# Patient Record
Sex: Male | Born: 1970 | Race: Black or African American | Hispanic: No | Marital: Single | Smoking: Current some day smoker
Health system: Southern US, Community
[De-identification: ages and names within clinical notes are randomized; demographics above are authoritative.]

## PROBLEM LIST (undated history)

## (undated) DIAGNOSIS — F419 Anxiety disorder, unspecified: Secondary | ICD-10-CM

## (undated) DIAGNOSIS — F319 Bipolar disorder, unspecified: Secondary | ICD-10-CM

## (undated) DIAGNOSIS — F329 Major depressive disorder, single episode, unspecified: Secondary | ICD-10-CM

## (undated) DIAGNOSIS — F32A Depression, unspecified: Secondary | ICD-10-CM

## (undated) DIAGNOSIS — G43909 Migraine, unspecified, not intractable, without status migrainosus: Secondary | ICD-10-CM

## (undated) HISTORY — DX: Migraine, unspecified, not intractable, without status migrainosus: G43.909

## (undated) HISTORY — PX: NO PAST SURGERIES: SHX2092

---

## 1997-05-24 ENCOUNTER — Emergency Department (HOSPITAL_COMMUNITY): Admission: EM | Admit: 1997-05-24 | Discharge: 1997-05-24 | Payer: Self-pay | Admitting: Emergency Medicine

## 2005-07-18 ENCOUNTER — Emergency Department (HOSPITAL_COMMUNITY): Admission: EM | Admit: 2005-07-18 | Discharge: 2005-07-18 | Payer: Self-pay | Admitting: *Deleted

## 2006-06-19 ENCOUNTER — Emergency Department (HOSPITAL_COMMUNITY): Admission: EM | Admit: 2006-06-19 | Discharge: 2006-06-19 | Payer: Self-pay | Admitting: Emergency Medicine

## 2008-10-16 ENCOUNTER — Emergency Department (HOSPITAL_COMMUNITY): Admission: EM | Admit: 2008-10-16 | Discharge: 2008-10-16 | Payer: Self-pay | Admitting: Emergency Medicine

## 2009-08-14 ENCOUNTER — Emergency Department (HOSPITAL_COMMUNITY): Admission: EM | Admit: 2009-08-14 | Discharge: 2009-08-14 | Payer: Self-pay | Admitting: Emergency Medicine

## 2010-08-08 ENCOUNTER — Emergency Department (HOSPITAL_COMMUNITY)
Admission: EM | Admit: 2010-08-08 | Discharge: 2010-08-08 | Disposition: A | Discharge status: Home / Self Care | Payer: Self-pay | Attending: Emergency Medicine | Admitting: Emergency Medicine

## 2010-08-08 DIAGNOSIS — M25519 Pain in unspecified shoulder: Secondary | ICD-10-CM | POA: Insufficient documentation

## 2010-08-08 DIAGNOSIS — R209 Unspecified disturbances of skin sensation: Secondary | ICD-10-CM | POA: Insufficient documentation

## 2010-10-28 LAB — CULTURE, ROUTINE-ABSCESS

## 2011-01-27 ENCOUNTER — Encounter (HOSPITAL_COMMUNITY): Payer: Self-pay | Admitting: *Deleted

## 2011-01-27 ENCOUNTER — Emergency Department (HOSPITAL_COMMUNITY)
Admission: EM | Admit: 2011-01-27 | Discharge: 2011-01-27 | Disposition: A | Discharge status: Home / Self Care | Payer: Self-pay | Attending: Emergency Medicine | Admitting: Emergency Medicine

## 2011-01-27 DIAGNOSIS — Z79899 Other long term (current) drug therapy: Secondary | ICD-10-CM | POA: Insufficient documentation

## 2011-01-27 DIAGNOSIS — R51 Headache: Secondary | ICD-10-CM | POA: Insufficient documentation

## 2011-01-27 DIAGNOSIS — J3489 Other specified disorders of nose and nasal sinuses: Secondary | ICD-10-CM | POA: Insufficient documentation

## 2011-01-27 DIAGNOSIS — F3130 Bipolar disorder, current episode depressed, mild or moderate severity, unspecified: Secondary | ICD-10-CM | POA: Insufficient documentation

## 2011-01-27 HISTORY — DX: Bipolar disorder, unspecified: F31.9

## 2011-01-27 HISTORY — DX: Depression, unspecified: F32.A

## 2011-01-27 HISTORY — DX: Major depressive disorder, single episode, unspecified: F32.9

## 2011-01-27 HISTORY — DX: Anxiety disorder, unspecified: F41.9

## 2011-01-27 MED ORDER — KETOROLAC TROMETHAMINE 60 MG/2ML IM SOLN
60.0000 mg | Freq: Once | INTRAMUSCULAR | Status: AC
  Administered 2011-01-27: 60 mg via INTRAMUSCULAR
  Filled 2011-01-27: qty 2

## 2011-01-27 NOTE — ED Notes (Signed)
Pt states "I have been having these h/a's normally around this time of year, have been having 2-3 a day, normally makes my ear hurt"

## 2011-01-27 NOTE — ED Provider Notes (Signed)
Medical screening examination/treatment/procedure(s) were performed by non-physician practitioner and as supervising physician I was immediately available for consultation/collaboration.  Cora Brierley R. Stevon Gough, MD 01/27/11 2308 

## 2011-01-27 NOTE — ED Provider Notes (Signed)
History     CSN: [id number]  Arrival date & time 01/27/11  1444   First MD Initiated Contact with Patient 01/27/11 1653      Chief Complaint  Patient presents with  . Headache    (Consider location/radiation/quality/duration/timing/severity/associated sxs/prior treatment) Patient is a 41 y.o. male presenting with headaches. The history is provided by the patient.  Headache  This is a recurrent problem.  Pt reports HAs that occur chronically "around this time of year". Has been experiencing this time for the last 2 weeks, several times a day, lasting a couple of hours at a time. Pain is severe, throbbing, typically located behind one eye or the other, currently behind the left eye. Pain does not radiate. Nothing in particular makes the pain worse. Took benadryl today prior to arrival with moderate relief of pain. There has been some assoc nasal congestion.  There is no associated dizziness, lightheadedness, head injury, confusion, change in vision, tinnitus, neck pain/stiffness, chest pain, SOB, N/V, numbness/weakness in any part of the body, or rash.  Past Medical History  Diagnosis Date  . Bipolar disorder   . Depression   . Anxiety     History reviewed. No pertinent past surgical history.  No family history on file.  History  Substance Use Topics  . Smoking status: Current Everyday Smoker -- 0.5 packs/day  . Smokeless tobacco: Not on file  . Alcohol Use: 3.6 oz/week    6 Cans of beer per week      Review of Systems  Neurological: Positive for headaches.  10 systems reviewed and are negative for acute change except as noted in the HPI.   Allergies  Review of patient's allergies indicates no known allergies.  Home Medications   Current Outpatient Rx  Name Route Sig Dispense Refill  . DIPHENHYDRAMINE HCL 25 MG PO TABS Oral Take 25 mg by mouth every 6 (six) hours as needed. allergies    . IBUPROFEN 200 MG PO TABS Oral Take 600 mg by mouth every 6 (six) hours as  needed. pain    . SERTRALINE HCL 100 MG PO TABS Oral Take 100 mg by mouth daily.    . TRAZODONE HCL 100 MG PO TABS Oral Take 100 mg by mouth at bedtime.      BP 111/60  Pulse 87  Temp 98 F (36.7 C)  Resp 20  Wt 238 lb (107.956 kg)  SpO2 99%  Physical Exam  Nursing note and vitals reviewed. Constitutional: He is oriented to person, place, and time. He appears well-developed and well-nourished. No distress.  HENT:  Head: Normocephalic and atraumatic.  Right Ear: External ear normal.  Left Ear: External ear normal.  Nose: Nose normal.  Mouth/Throat: Oropharynx is clear and moist. No oropharyngeal exudate.       Bilateral TM normal  Eyes: Conjunctivae and EOM are normal. Pupils are equal, round, and reactive to light.       No nystagmus. Visual fields full to confrontation bilaterally.  Neck: Normal range of motion. Neck supple.  Cardiovascular: Normal rate, regular rhythm and normal heart sounds.   No murmur heard. Pulmonary/Chest: Effort normal and breath sounds normal. No respiratory distress. He has no wheezes. He exhibits no tenderness.  Abdominal: Soft. Bowel sounds are normal. There is no tenderness.  Musculoskeletal: He exhibits no edema and no tenderness.  Lymphadenopathy:    He has no cervical adenopathy.  Neurological: He is alert and oriented to person, place, and time. No cranial nerve deficit. Coordination (F-N  intact bilaterally.) normal.       Gait normal without ataxia. MAEW. Sensation intact to light touch.  GCS 15.  Skin: Skin is warm and dry. No rash noted.  Psychiatric: He has a normal mood and affect. His behavior is normal.    ED Course  Procedures (including critical care time)  Labs Reviewed - No data to display No results found.   Dx 1: HA   MDM  HA, recurrent. No fever, neck pain- do not suspect meningitis. Neuro exam grossly normal, no head injury, do not suspect SDH. Not sudden onset or "worst HA of life", typical for pt- do not suspect SAH.  Advised NSAID use with primary doctor follow-up for continued management.   [address] [place], [place] 01/27/11 1749

## 2011-01-27 NOTE — ED Notes (Signed)
Pt c/o of headache and right ear pain. Pt reports that headaches have been intermittent 2-3 times a day.

## 2017-10-08 ENCOUNTER — Emergency Department (HOSPITAL_COMMUNITY)
Admission: EM | Admit: 2017-10-08 | Discharge: 2017-10-08 | Disposition: A | Discharge status: Home / Self Care | Payer: Self-pay | Attending: Emergency Medicine | Admitting: Emergency Medicine

## 2017-10-08 ENCOUNTER — Encounter (HOSPITAL_COMMUNITY): Payer: Self-pay

## 2017-10-08 ENCOUNTER — Emergency Department (HOSPITAL_COMMUNITY): Payer: Self-pay

## 2017-10-08 ENCOUNTER — Other Ambulatory Visit: Payer: Self-pay

## 2017-10-08 DIAGNOSIS — F419 Anxiety disorder, unspecified: Secondary | ICD-10-CM | POA: Insufficient documentation

## 2017-10-08 DIAGNOSIS — F319 Bipolar disorder, unspecified: Secondary | ICD-10-CM | POA: Insufficient documentation

## 2017-10-08 DIAGNOSIS — F17200 Nicotine dependence, unspecified, uncomplicated: Secondary | ICD-10-CM | POA: Insufficient documentation

## 2017-10-08 DIAGNOSIS — K5900 Constipation, unspecified: Secondary | ICD-10-CM | POA: Insufficient documentation

## 2017-10-08 DIAGNOSIS — R202 Paresthesia of skin: Secondary | ICD-10-CM | POA: Insufficient documentation

## 2017-10-08 LAB — COMPREHENSIVE METABOLIC PANEL
ALK PHOS: 64 U/L (ref 38–126)
ALT: 27 U/L (ref 0–44)
AST: 28 U/L (ref 15–41)
Albumin: 3.7 g/dL (ref 3.5–5.0)
Anion gap: 12 (ref 5–15)
BILIRUBIN TOTAL: 0.6 mg/dL (ref 0.3–1.2)
BUN: 6 mg/dL (ref 6–20)
CALCIUM: 9 mg/dL (ref 8.9–10.3)
CHLORIDE: 104 mmol/L (ref 98–111)
CO2: 24 mmol/L (ref 22–32)
CREATININE: 1.17 mg/dL (ref 0.61–1.24)
GFR calc Af Amer: >60 mL/min (ref >60)
GLUCOSE: 94 mg/dL (ref 70–99)
Potassium: 3.5 mmol/L (ref 3.5–5.1)
Sodium: 140 mmol/L (ref 135–145)
Total Protein: 7.1 g/dL (ref 6.5–8.1)

## 2017-10-08 LAB — CBC
HCT: 43 % (ref 39.0–52.0)
Hemoglobin: 14.3 g/dL (ref 13.0–17.0)
MCH: 30.9 pg (ref 26.0–34.0)
MCHC: 33.3 g/dL (ref 30.0–36.0)
MCV: 92.9 fL (ref 78.0–100.0)
PLATELETS: 316 10*3/uL (ref 150–400)
RBC: 4.63 MIL/uL (ref 4.22–5.81)
RDW: 13.5 % (ref 11.5–15.5)
WBC: 7.9 10*3/uL (ref 4.0–10.5)

## 2017-10-08 LAB — URINALYSIS, ROUTINE W REFLEX MICROSCOPIC
BILIRUBIN URINE: NEGATIVE
GLUCOSE, UA: NEGATIVE mg/dL
HGB URINE DIPSTICK: NEGATIVE
Ketones, ur: NEGATIVE mg/dL
Leukocytes, UA: NEGATIVE
Nitrite: NEGATIVE
Protein, ur: NEGATIVE mg/dL
Specific Gravity, Urine: 1.003 — ABNORMAL LOW (ref 1.005–1.030)
pH: 6 (ref 5.0–8.0)

## 2017-10-08 LAB — CK TOTAL AND CKMB (NOT AT ARMC)
CK TOTAL: 331 U/L (ref 49–397)
CK, MB: 4 ng/mL (ref 0.5–5.0)
Relative Index: 1.2 (ref 0.0–2.5)

## 2017-10-08 LAB — LIPASE, BLOOD: Lipase: 35 U/L (ref 11–51)

## 2017-10-08 LAB — MAGNESIUM: Magnesium: 2.1 mg/dL (ref 1.7–2.4)

## 2017-10-08 MED ORDER — SODIUM CHLORIDE 0.9 % IV BOLUS
1000.0000 mL | Freq: Once | INTRAVENOUS | Status: AC
  Administered 2017-10-08: 1000 mL via INTRAVENOUS

## 2017-10-08 MED ORDER — POLYETHYLENE GLYCOL 3350 17 G PO PACK: 17.0000 g | PACK | Freq: Every day | ORAL | 0 refills | Status: DC

## 2017-10-08 MED ORDER — DICYCLOMINE HCL 20 MG PO TABS: 20.0000 mg | ORAL_TABLET | Freq: Two times a day (BID) | ORAL | 0 refills | Status: DC

## 2017-10-08 MED ORDER — DICYCLOMINE HCL 10 MG PO CAPS
10.0000 mg | ORAL_CAPSULE | Freq: Once | ORAL | Status: AC
  Administered 2017-10-08: 10 mg via ORAL
  Filled 2017-10-08: qty 1

## 2017-10-08 NOTE — ED Provider Notes (Signed)
[facility]-EMERGENCY DEPT Provider Note   CSN: [id number] Arrival date & time: 10/08/17  1403     History   Chief Complaint Chief Complaint  Patient presents with  . Constipation  . Abdominal Pain  . numbness thighs  . abdominal distention.    HPI Jonathon Dickerson is a 47 y.o. male with PMH/o Anxiety, Bipolar who presents for evaluation of abnormal bowel movements, abdominal distention has been ongoing for last 2 days.  He reports that 2 days ago, he took 59 Vitafusion men's vitamins gummies.  He states that he took them because "they tasted good."  Patient states that since then, he has had abnormal bowel movements he states that normally he goes every day.  He states that since then, his bowel movements have been more loose than normal.  He reports that he had a watery, loose bowel movement yesterday.  He reports that this is not his normal bowel movements.  He took an over-the-counter laxative yesterday to see if that would help with symptoms.  Denies any recent antibiotics or travel outside the country.  No blood noted in stool.  He is still passing flatus.  He states that he feels like his abdomen is distended.  He states that he feels like he is cramping and stiff from his abdomen to his legs.  He also reports he has been having some numbness/tingling sensation to the distal ends of bilateral fingertips and bilateral thighs.  He states that the numbness sensation is making it hard for him to walk.  He states that he has not had any speech difficulty, blurry vision.  Patient denies any fevers, chest pain, difficulty breathing.   The history is provided by the patient.    Past Medical History:  Diagnosis Date  . Anxiety   . Bipolar disorder (HCC)   . Depression     There are no active problems to display for this patient.   History reviewed. No pertinent surgical history.      Home Medications    Prior to Admission medications   Medication Sig  Start Date End Date Taking? Authorizing Provider  Sennosides 25 MG TABS Take 25-50 mg by mouth 2 (two) times daily as needed (constipation).   Yes [provider]  dicyclomine (BENTYL) 20 MG tablet Take 1 tablet (20 mg total) by mouth 2 (two) times daily. 10/08/17   Maxwell Caul, PA-C  polyethylene glycol (MIRALAX) packet Take 17 g by mouth daily. 10/08/17   Maxwell Caul, PA-C    Family History Family History  Problem Relation Age of Onset  . Diabetes Mother     Social History Social History   Tobacco Use  . Smoking status: Current Every Day Smoker    Packs/day: 0.15  . Smokeless tobacco: Never Used  Substance Use Topics  . Alcohol use: Yes    Alcohol/week: 6.0 standard drinks    Types: 6 Cans of beer per week  . Drug use: No     Allergies   Patient has no known allergies.   Review of Systems Review of Systems  Constitutional: Negative for fever.  Respiratory: Negative for cough and shortness of breath.   Cardiovascular: Negative for chest pain.  Gastrointestinal: Positive for abdominal distention, constipation and diarrhea. Negative for abdominal pain, nausea and vomiting.  Genitourinary: Negative for dysuria and hematuria.  Neurological: Positive for numbness. Negative for weakness and headaches.  All other systems reviewed and are negative.    Physical Exam Updated  Vital Signs BP (!) 117/59   Pulse (!) 48   Temp 97.7 F (36.5 C) (Oral)   Resp 15   Ht 5\' 9"  (1.753 m)   Wt 99.8 kg   SpO2 100%   BMI 32.49 kg/m   Physical Exam  Constitutional: He is oriented to person, place, and time. He appears well-developed and well-nourished.  HENT:  Head: Normocephalic and atraumatic.  Mouth/Throat: Oropharynx is clear and moist and mucous membranes are normal.  Eyes: Pupils are equal, round, and reactive to light. Conjunctivae, EOM and lids are normal.  Neck: Full passive range of motion without pain.  Cardiovascular: Normal rate, regular rhythm,  normal heart sounds and normal pulses. Exam reveals no gallop and no friction rub.  No murmur heard. Pulses:      Radial pulses are 2+ on the right side, and 2+ on the left side.       Dorsalis pedis pulses are 2+ on the right side, and 2+ on the left side.  Pulmonary/Chest: Effort normal and breath sounds normal.  Abdominal: Soft. Normal appearance. He exhibits no distension. There is generalized tenderness. There is no rigidity and no guarding.  Abdomen is soft, non-distended.  Diffuse tenderness noted throughout. No focal tenderness.  No rigidity, guarding.  Musculoskeletal: Normal range of motion.  Neurological: He is alert and oriented to person, place, and time.  Cranial nerves III-XII intact Follows commands, Moves all extremities  5/5 strength to BUE and BLE  Subjective decreased sensation noted to the distal tips of the bilateral fingers and anterior aspects of bilateral thighs. Otherwise sensation intact. Decreased sensation does not follow a specific dermatoma. Able to distinguish soft and hard except for scattered places to the anterior thigh  Normal finger to nose. No dysdiadochokinesia. No pronator drift. No gait abnormalities  No slurred speech. No facial droop.   Skin: Skin is warm and dry. Capillary refill takes less than 2 seconds.  Good distal cap refill. BUE and BLE are not dusky in appearance or cool to touch.  Psychiatric: He has a normal mood and affect. His speech is normal.  Nursing note and vitals reviewed.    ED Treatments / Results  Labs (all labs ordered are listed, but only abnormal results are displayed) Labs Reviewed  URINALYSIS, ROUTINE W REFLEX MICROSCOPIC - Abnormal; Notable for the following components:      Result Value   Color, Urine STRAW (*)    Specific Gravity, Urine 1.003 (*)    All other components within normal limits  LIPASE, BLOOD  COMPREHENSIVE METABOLIC PANEL  CBC  CK TOTAL AND CKMB (NOT AT [facility])  MAGNESIUM     EKG None  Radiology Dg Abd 2 Views  Result Date: 10/08/2017 CLINICAL DATA:  Constipation, abdominal distention. EXAM: ABDOMEN - 2 VIEW COMPARISON:  None. FINDINGS: The bowel gas pattern is normal. Moderate amount of stool seen throughout the colon. There is no evidence of free air. No radio-opaque calculi or other significant radiographic abnormality is seen. IMPRESSION: Moderate stool burden.  No evidence of bowel obstruction or ileus. Electronically Signed   By: Lupita Raider, M.D.   On: 10/08/2017 19:27    Procedures Procedures (including critical care time)  Medications Ordered in ED Medications  sodium chloride 0.9 % bolus 1,000 mL (0 mLs Intravenous Stopped 10/08/17 2147)  dicyclomine (BENTYL) capsule 10 mg (10 mg Oral Given 10/08/17 2147)     Initial Impression / Assessment and Plan / ED Course  I have reviewed the triage  vital signs and the nursing notes.  Pertinent labs & imaging results that were available during my care of the patient were reviewed by me and considered in my medical decision making (see chart for details).     47 year old male past with history of anxiety and bipolar who presents for evaluation of constipation, abdominal distention, numbness to bilateral fingertips and anterior aspect of thighs.  He states that this occurred for last 2 days after he ate 33 vitamins.  He states that he was doing it because "they tasted good."  Patient states he has had bowel movements but states that they are softer than he normally experiences.  No blood noted in stool.  Last bowel movement yesterday.  He still passing flatus.  No vomiting, fevers. Patient is afebrile, non-toxic appearing, sitting comfortably on examination table. Vital signs reviewed and stable.  On exam, patient with diffuse abdominal tenderness.  No focal point.  Abdomen is soft, not distended.  Normal neuro exam.  He does have some subjective decreased sensation to scattered points to tips of fingers  bilaterally in anterior aspect of thighs.  He is otherwise able to differentiate between sharp and dull.  Consider likely imbalance versus anxiety.  At this time, do not suspect small bowel obstruction as patient has not had any vomiting.  He still passing flatus without any difficulty.  He has been having bowel movements he does states that they are more loose than normal.  Additionally, do not suspect appendicitis, diverticulitis, perforation given reassuring exam.  I suspect that his paresthesias are likely secondary to anxiety versus electively imbalance.  His distribution and the fact that is bilateral and more towards the tips of his fingers and anterior aspects of his thighs is not concerning for CVA.  Additionally, his decreased sensation does not follow specific dermatome.  He exhibits no other neuro deficits that would be concerning for CVA.  Patient states that he took the vitamins because "they tasted good."  Do not suspect self-harm.  Discussed with Dr. Rush Landmark.  We will add additional magnesium and CK for evaluation of electrolyte abnormalities.  UA reviewed.  Negative for any acute infection.  Lipase unremarkable.  CBC without any significant leukocytosis or anemia.  CMP is unremarkable.  Mag is unremarkable.  CKs without any acute ab normalities.  Abdominal exam shows moderate stool burden.  No evidence of obstruction.  Discussed results with patient.  Repeat abdominal abdominal exam is benign.  Exam not concerning for diverticular-itis, appendicitis, perforation, obstruction.  I discussed with him that we could try sending him home on MiraLAX or do an enema for continued constipation.  At this time, do not suspect the patient is impacted as he is still having bowel movements they just softer.  Patient opted for MiraLAX at home.  Instructed not to take any more vitamins.  Encouraged him to follow-up with [facility] to establish primary care.  At this time, patient exhibits no emergent  life-threatening condition that require further evaluation in ED.  Final Clinical Impressions(s) / ED Diagnoses   Final diagnoses:  Constipation, unspecified constipation type  Paresthesia    ED Discharge Orders         Ordered    dicyclomine (BENTYL) 20 MG tablet  2 times daily     10/08/17 2130    polyethylene glycol (MIRALAX) packet  Daily     10/08/17 2130           Maxwell Caul, PA-C 10/08/17 2320  Tegeler, Canary Brim, MD 10/09/17 [phone]

## 2017-10-08 NOTE — ED Notes (Signed)
Blood draw attempted in left ac, small blood return followed by vein collapse before collection

## 2017-10-08 NOTE — ED Triage Notes (Addendum)
Patient reports that he bought some Vita Fusion Men's vitmins and took 33 vitamins in the past 2 days. Patient states that he has not had a normal BM in 2 days, abdominal distention and numbness in his fingers and thighs. Patient states that he took a laxative with no relief.

## 2017-10-08 NOTE — Discharge Instructions (Addendum)
Take Bentyl as directed.  Take MiraLAX as directed to help with constipation.  When she is having regular bowel movements, stop taking it.  As we discussed, do not take any more vitamins.  Follow-up with your primary care doctor or [facility] to establish primary care.  Please return to the emerge department for any fevers, vomiting, blood in stool, difficulty breathing, weakness of one arm or one leg, any other worsening concerning symptoms.

## 2017-10-11 ENCOUNTER — Other Ambulatory Visit: Payer: Self-pay

## 2017-10-11 ENCOUNTER — Encounter (HOSPITAL_BASED_OUTPATIENT_CLINIC_OR_DEPARTMENT_OTHER): Payer: Self-pay

## 2017-10-11 DIAGNOSIS — Z79899 Other long term (current) drug therapy: Secondary | ICD-10-CM | POA: Insufficient documentation

## 2017-10-11 DIAGNOSIS — R202 Paresthesia of skin: Secondary | ICD-10-CM | POA: Insufficient documentation

## 2017-10-11 DIAGNOSIS — R269 Unspecified abnormalities of gait and mobility: Secondary | ICD-10-CM | POA: Insufficient documentation

## 2017-10-11 DIAGNOSIS — F17210 Nicotine dependence, cigarettes, uncomplicated: Secondary | ICD-10-CM | POA: Insufficient documentation

## 2017-10-11 NOTE — ED Triage Notes (Signed)
Pt was seen on 10/08/17 at [facility], is here for the exact same complaints, feels like his abdomen is "tight" which it is not, and states his paresthesias are still bothering him, pt has not scheduled a f/u outpatient appointment as was recommended

## 2017-10-12 ENCOUNTER — Emergency Department (HOSPITAL_COMMUNITY): Payer: Self-pay

## 2017-10-12 ENCOUNTER — Emergency Department (HOSPITAL_BASED_OUTPATIENT_CLINIC_OR_DEPARTMENT_OTHER)
Admission: EM | Admit: 2017-10-12 | Discharge: 2017-10-12 | Disposition: A | Discharge status: Home / Self Care | Payer: Self-pay | Attending: Emergency Medicine | Admitting: Emergency Medicine

## 2017-10-12 ENCOUNTER — Emergency Department (HOSPITAL_BASED_OUTPATIENT_CLINIC_OR_DEPARTMENT_OTHER): Payer: Self-pay

## 2017-10-12 DIAGNOSIS — R269 Unspecified abnormalities of gait and mobility: Secondary | ICD-10-CM

## 2017-10-12 DIAGNOSIS — R202 Paresthesia of skin: Secondary | ICD-10-CM

## 2017-10-12 LAB — COMPREHENSIVE METABOLIC PANEL
ALBUMIN: 3.9 g/dL (ref 3.5–5.0)
ALT: 34 U/L (ref 0–44)
ANION GAP: 8 (ref 5–15)
AST: 31 U/L (ref 15–41)
Alkaline Phosphatase: 63 U/L (ref 38–126)
BILIRUBIN TOTAL: 0.3 mg/dL (ref 0.3–1.2)
BUN: 16 mg/dL (ref 6–20)
CO2: 24 mmol/L (ref 22–32)
Calcium: 9.2 mg/dL (ref 8.9–10.3)
Chloride: 107 mmol/L (ref 98–111)
Creatinine, Ser: 1.29 mg/dL — ABNORMAL HIGH (ref 0.61–1.24)
GFR calc Af Amer: >60 mL/min (ref >60)
GFR calc non Af Amer: >60 mL/min (ref >60)
GLUCOSE: 93 mg/dL (ref 70–99)
Potassium: 3.9 mmol/L (ref 3.5–5.1)
Sodium: 139 mmol/L (ref 135–145)
Total Protein: 7.6 g/dL (ref 6.5–8.1)

## 2017-10-12 LAB — RAPID URINE DRUG SCREEN, HOSP PERFORMED
Amphetamines: NOT DETECTED
Barbiturates: NOT DETECTED
Benzodiazepines: NOT DETECTED
Cocaine: POSITIVE — AB
OPIATES: NOT DETECTED
TETRAHYDROCANNABINOL: NOT DETECTED

## 2017-10-12 LAB — URINALYSIS, ROUTINE W REFLEX MICROSCOPIC
BILIRUBIN URINE: NEGATIVE
Glucose, UA: NEGATIVE mg/dL
KETONES UR: NEGATIVE mg/dL
Leukocytes, UA: NEGATIVE
NITRITE: NEGATIVE
PROTEIN: NEGATIVE mg/dL
pH: 5.5 (ref 5.0–8.0)

## 2017-10-12 LAB — CBC WITH DIFFERENTIAL/PLATELET
BASOS PCT: 1 %
Basophils Absolute: 0.1 10*3/uL (ref 0.0–0.1)
Eosinophils Absolute: 0.4 10*3/uL (ref 0.0–0.7)
Eosinophils Relative: 4 %
HCT: 45 % (ref 39.0–52.0)
HEMOGLOBIN: 15.3 g/dL (ref 13.0–17.0)
LYMPHS ABS: 3.1 10*3/uL (ref 0.7–4.0)
LYMPHS PCT: 30 %
MCH: 30.9 pg (ref 26.0–34.0)
MCHC: 34 g/dL (ref 30.0–36.0)
MCV: 90.9 fL (ref 78.0–100.0)
MONOS PCT: 7 %
Monocytes Absolute: 0.7 10*3/uL (ref 0.1–1.0)
NEUTROS ABS: 5.9 10*3/uL (ref 1.7–7.7)
NEUTROS PCT: 58 %
Platelets: 306 10*3/uL (ref 150–400)
RBC: 4.95 MIL/uL (ref 4.22–5.81)
RDW: 13.8 % (ref 11.5–15.5)
WBC: 10.1 10*3/uL (ref 4.0–10.5)

## 2017-10-12 LAB — URINALYSIS, MICROSCOPIC (REFLEX)

## 2017-10-12 LAB — LIPASE, BLOOD: LIPASE: 38 U/L (ref 11–51)

## 2017-10-12 LAB — CK: CK TOTAL: 207 U/L (ref 49–397)

## 2017-10-12 LAB — I-STAT CG4 LACTIC ACID, ED: Lactic Acid, Venous: 0.48 mmol/L — ABNORMAL LOW (ref 0.5–1.9)

## 2017-10-12 MED ORDER — IOPAMIDOL (ISOVUE-300) INJECTION 61%
100.0000 mL | Freq: Once | INTRAVENOUS | Status: AC | PRN
  Administered 2017-10-12: 100 mL via INTRAVENOUS

## 2017-10-12 MED ORDER — OXYCODONE-ACETAMINOPHEN 5-325 MG PO TABS
1.0000 | ORAL_TABLET | Freq: Once | ORAL | Status: AC
  Administered 2017-10-12: 1 via ORAL
  Filled 2017-10-12: qty 1

## 2017-10-12 NOTE — ED Notes (Signed)
Patient transported to CT 

## 2017-10-12 NOTE — ED Notes (Signed)
Patient transported to MRI 

## 2017-10-12 NOTE — ED Notes (Signed)
Patient has returned from MRI

## 2017-10-12 NOTE — ED Provider Notes (Signed)
[facility] EMERGENCY DEPARTMENT Provider Note   CSN: [id number] Arrival date & time: 10/11/17  2332     History   Chief Complaint Chief Complaint  Patient presents with  . Multiple Complaints    HPI RIELEY Dickerson is a 47 y.o. male.  -Patient reports tight sensation in his abdomen that radiates down to his thighs.  Reports this is been going on for about the past 5 days.  He was seen in the ED for this on September 29 and discharged home.  He states the symptoms started after he took 33 gummy vitamins within 48 hours.  He reports he is getting worse since he was seen in the [facility] ED has had difficulty walking because of numbness in his legs and numbness in his hands.  He describes numbness in his bilateral feet as well as his fingertips bilaterally without muscular weakness.  He has not had any fevers, chills, nausea or vomiting.  Has had several episodes of diarrhea since he was last seen in the ED. But he has been using stool softeners. He is still wanting to eat and drink.  No chest pain or shortness of breath.  He feels stiffness and cramping in his legs and is having to walk with a wide-based gait which is something new.  He describes a feeling of tightness and something wrapped around his abdomen and thighs.  The history is provided by the patient.    Past Medical History:  Diagnosis Date  . Anxiety   . Bipolar disorder (HCC)   . Depression     There are no active problems to display for this patient.   History reviewed. No pertinent surgical history.      Home Medications    Prior to Admission medications   Medication Sig Start Date End Date Taking? Authorizing Provider  dicyclomine (BENTYL) 20 MG tablet Take 1 tablet (20 mg total) by mouth 2 (two) times daily. 10/08/17   Maxwell Caul, PA-C  polyethylene glycol (MIRALAX) packet Take 17 g by mouth daily. 10/08/17   Graciella Freer A, PA-C  Sennosides 25 MG TABS Take 25-50 mg by mouth 2 (two)  times daily as needed (constipation).    [provider]    Family History Family History  Problem Relation Age of Onset  . Diabetes Mother     Social History Social History   Tobacco Use  . Smoking status: Current Every Day Smoker    Packs/day: 0.15  . Smokeless tobacco: Never Used  Substance Use Topics  . Alcohol use: Yes    Alcohol/week: 6.0 standard drinks    Types: 6 Cans of beer per week  . Drug use: No     Allergies   Patient has no known allergies.   Review of Systems Review of Systems  Constitutional: Positive for activity change and appetite change. Negative for fever.  HENT: Negative for congestion, nosebleeds and postnasal drip.   Respiratory: Negative for cough, chest tightness and shortness of breath.   Cardiovascular: Negative for chest pain and leg swelling.  Gastrointestinal: Positive for abdominal pain and diarrhea. Negative for nausea and vomiting.  Genitourinary: Negative for decreased urine volume, dysuria, hematuria and urgency.  Musculoskeletal: Positive for arthralgias and myalgias.  Skin: Negative for rash.  Neurological: Positive for weakness and numbness. Negative for dizziness and headaches.   all other systems are negative except as noted in the HPI and PMH.     Physical Exam Updated Vital Signs BP 110/71 (  BP Location: Left Arm)   Pulse 65   Temp 98.5 F (36.9 C) (Oral)   Resp 18   Ht 5\' 9"  (1.753 m)   Wt 99.7 kg   SpO2 96%   BMI 32.46 kg/m   Physical Exam  Constitutional: He is oriented to person, place, and time. He appears well-developed and well-nourished. No distress.  HENT:  Head: Normocephalic and atraumatic.  Mouth/Throat: Oropharynx is clear and moist. No oropharyngeal exudate.  Eyes: Pupils are equal, round, and reactive to light. Conjunctivae and EOM are normal.  Neck: Normal range of motion. Neck supple.  No meningismus.  Cardiovascular: Normal rate, regular rhythm, normal heart sounds and intact distal  pulses.  No murmur heard. Pulmonary/Chest: Effort normal and breath sounds normal. No respiratory distress. He exhibits no tenderness.  Abdominal: Soft. There is tenderness. There is no rebound and no guarding.  Mild diffuse tenderness  Genitourinary:  Genitourinary Comments: Rectal sensation normal no fecal impaction  Musculoskeletal: Normal range of motion. He exhibits no edema or tenderness.  5/5 strength in bilateral lower extremities. Ankle plantar and dorsiflexion intact. Great toe extension intact bilaterally. +2 DP and PT pulses. +2 patellar reflexes bilaterally.   Wide-based ataxic gait  Neurological: He is alert and oriented to person, place, and time. No cranial nerve deficit. He exhibits normal muscle tone. Coordination abnormal.  No ataxia on finger to nose bilaterally. No pronator drift. 5/5 strength throughout. CN 2-12 intact.Equal grip strength. Subjective decreased sensation to bilateral anterior thighs.  Subjective paresthesia to bilateral hands and feet.  +2 patellar reflexes.  5/5 strength throughout. Wide-based ataxic gait.  Skin: Skin is warm.  Psychiatric: He has a normal mood and affect. His behavior is normal.  Nursing note and vitals reviewed.    ED Treatments / Results  Labs (all labs ordered are listed, but only abnormal results are displayed) Labs Reviewed  COMPREHENSIVE METABOLIC PANEL - Abnormal; Notable for the following components:      Result Value   Creatinine, Ser 1.29 (*)    All other components within normal limits  URINALYSIS, ROUTINE W REFLEX MICROSCOPIC - Abnormal; Notable for the following components:   Specific Gravity, Urine >1.030 (*)    Hgb urine dipstick SMALL (*)    All other components within normal limits  RAPID URINE DRUG SCREEN, HOSP PERFORMED - Abnormal; Notable for the following components:   Cocaine POSITIVE (*)    All other components within normal limits  URINALYSIS, MICROSCOPIC (REFLEX) - Abnormal; Notable for the  following components:   Bacteria, UA FEW (*)    All other components within normal limits  I-STAT CG4 LACTIC ACID, ED - Abnormal; Notable for the following components:   Lactic Acid, Venous 0.48 (*)    All other components within normal limits  CULTURE, BLOOD (ROUTINE X 2)  CULTURE, BLOOD (ROUTINE X 2)  CBC WITH DIFFERENTIAL/PLATELET  LIPASE, BLOOD    EKG None  Radiology Ct Head Wo Contrast  Result Date: 10/12/2017 CLINICAL DATA:  Ataxia, stroke suspected EXAM: CT HEAD WITHOUT CONTRAST TECHNIQUE: Contiguous axial images were obtained from the base of the skull through the vertex without intravenous contrast. Patient received IV contrast for abdominal CT 2 hours prior. COMPARISON:  None. FINDINGS: Brain: Presence of intravascular contrast from recent abdominal CT limits assessment for acute hemorrhage. Allowing for this, no intracranial hemorrhage is seen. No evidence of acute ischemia. No hydrocephalus, mass effect, or midline shift. Vascular: Intravascular contrast related to recent contrast-enhanced abdominal CT. Skull: No fracture or  focal lesion. Sinuses/Orbits: Minor mucosal thickening of the maxillary sinuses. No sinus fluid levels. Visualized orbits are unremarkable. Other: None. IMPRESSION: 1. No evidence of acute ischemia. 2. Presence of intravascular contrast from recent abdominal CT limits assessment for acute hemorrhage. Allowing for this limitation, no hemorrhage or evidence of acute intracranial abnormality. Electronically Signed   By: Narda Rutherford M.D.   On: 10/12/2017 06:58   Ct Abdomen Pelvis W Contrast  Result Date: 10/12/2017 CLINICAL DATA:  Acute abdominal pain.  Vomiting. EXAM: CT ABDOMEN AND PELVIS WITH CONTRAST TECHNIQUE: Multidetector CT imaging of the abdomen and pelvis was performed using the standard protocol following bolus administration of intravenous contrast. CONTRAST:  ISOVUE-300 IOPAMIDOL (ISOVUE-300) INJECTION 61% COMPARISON:  Radiograph 10/08/2017  FINDINGS: Lower chest: Mild motion artifact. Fluid-filled patulous esophagus, patient vomited during the exam. Hepatobiliary: No focal liver abnormality is seen. No gallstones, gallbladder wall thickening, or biliary dilatation. Pancreas: No ductal dilatation or inflammation. Spleen: Normal in size without focal abnormality. Splenule inferiorly. Adrenals/Urinary Tract: Normal adrenal glands. No hydronephrosis or perinephric edema. Homogeneous renal enhancement with symmetric excretion on delayed phase imaging. Urinary bladder is partially distended without wall thickening. Stomach/Bowel: Fluid-filled patulous distal esophagus consistent with vomiting. Borderline gastric wall thickening. No perigastric inflammation. Administered enteric contrast reaches distal small bowel. No small bowel wall thickening or inflammatory change. Normal appendix. Small volume of colonic stool without colonic wall thickening or inflammatory change. Minimal diverticulosis of the distal descending and sigmoid colon. No diverticulitis. Vascular/Lymphatic: No significant vascular findings are present. No enlarged abdominal or pelvic lymph nodes. Reproductive: Prostate is unremarkable. Other: No free air or ascites.  No intra-abdominal abscess. Musculoskeletal: There are no acute or suspicious osseous abnormalities. Scattered bone islands in the pelvis. IMPRESSION: 1. Dilated distal esophagus with fluid level consistent with vomiting, the patient vomited during the exam. Equivocal mild gastric wall thickening which may represent gastritis or peptic ulcer disease. 2. No other acute findings in the abdomen or pelvis. Electronically Signed   By: Narda Rutherford M.D.   On: 10/12/2017 05:26    Procedures Procedures (including critical care time)  Medications Ordered in ED Medications - No data to display   Initial Impression / Assessment and Plan / ED Course  I have reviewed the triage vital signs and the nursing notes.  Pertinent  labs & imaging results that were available during my care of the patient were reviewed by me and considered in my medical decision making (see chart for details).    Patient with abdominal pain and tightness as well as tightness in his legs and numbness and tingling in his fingers and toes.  This has been ongoing for about 5 days. And started after he took an excessive amount of multivitamins. He reports he is having difficulty walking because of this.  Labs reassuring.  Postvoid residual 20 mL's. Drug screen positive for cocaine. Afebrile.  Normal rectal tone. Intact patellar reflexes.  Low suspicion for cord compression or cauda equina.  Doubt Guillain-Barr. Abdomen is soft without peritoneal signs.  Patient still complaining of numbness and difficulty walking and wide-based gait.  Feels like there is something tighter on his thighs and has having numbness and tingling.  He has normal rectal tone, no urinary retention and no fever.  He admits to using cocaine but denies any injection use.  Unclear source of patient's decreased sensation in his thighs and numbness and tingling in his hands. CT scan is unrevealing.  With ongoing sensory deficits and difficulty walking will benefit  from MRI for further evaluation.  Unclear source of patient's gait abnormality and paresthesias.  May benefit from MRI and neurology consult.  Discussed with Dr. Ophelia Charter of the hospitalist service who requests transfer for MRI before agreeing to admit the patient.  Patient will be transferred to [facility] for MRIs and neurology consult as necessary.  Discussed with Dr. Clarene Duke in the ED.  Will check MRI of thoracic and lumbar spine as well as brain to rule out new demyelinating disease.  Patient agreeable to transfer. Final Clinical Impressions(s) / ED Diagnoses   Final diagnoses:  Gait abnormality  Paresthesia    ED Discharge Orders    None       Caralynn Gelber, Jeannett Senior, MD 10/12/17 [phone]

## 2017-10-12 NOTE — ED Notes (Signed)
Refuses [facility] for transport to [facility] ED. Waiting for ride , ED MD aware

## 2017-10-12 NOTE — Discharge Instructions (Addendum)
RETURN TO ER IF ANY EXTREMITY WEAKNESS, WORSENING BALANCE OR WALKING PROBLEMS, SPEECH PROBLEMS, CONFUSION, BOWEL OR BLADDER INCONTINENCE, OR NEW NUMBNESS.

## 2017-10-12 NOTE — Progress Notes (Signed)
Called for transfer from [facility] on this 47yo male with bipolar presenting with abdominal pain, distention, and tightness in abdomen.  He was seen in the ER for this issue on 9/29 and discharged.  He feels a tight sensation from thighs to abdomen like a band around him and has difficulty ambulating as a result.  Evaluation is benign other than cocaine positive.  Normal rectal tone.  CT abdomen unremarkable.  He is able to walk but has an unusual wide-based gait and paresthesias in anterior thighs. He needs an MRI of his spine.  He is able to ambulate.  Based on patient needing only an MRI and possibly a neuro consult at this time, it seems reasonable to do an ER:ER transfer for further evaluation.  If he needs consultation for admission based on those results, [facility] will be happy to evaluate him at that time.  Dr. Manus Gunning is in agreement with this plan.   Georgana Curio, M.D.

## 2017-10-12 NOTE — ED Notes (Signed)
Report called to Wendie Chess, charge nurse at [facility] ED

## 2017-10-12 NOTE — ED Notes (Signed)
Re-called [facility] spoke to [facility]

## 2017-10-12 NOTE — ED Provider Notes (Signed)
I received this patient in transfer from [facility] where he had been evaluated by Dr. Manus Gunning for multiple complaints including paresthesias.  He was transferred for MRI.  Patient had MRI of brain, thoracic, and lumbar spine which were overall unremarkable and showed no acute findings to explain his symptoms.  Patient was comfortable on reassessment.  He states that all of his symptoms started after taking all of the gummy vitamins.  Ambulated patient here; he was slightly off balance occasionally but able to ambulate independently and had no ongoing broad-based gait.  Given his gait improvement and reassuring MR imaging, I feel he can follow-up with neurology in the clinic.  He feels comfortable going home and I have recommended supportive measures including discontinuing multivitamins and supplements, drinking plenty of water, and following up with neurology as needed for persistent symptoms.  Extensively reviewed return precautions and he voiced understanding.   Little, Ambrose Finland, MD 10/12/17 [phone]

## 2017-10-12 NOTE — ED Notes (Signed)
Pt ambulated independently around nurses station without assist.

## 2017-10-12 NOTE — ED Notes (Signed)
Patient arrived POV from [facility] for MRI of brain and thoracic and lumbar spine. Patient ambulatory in triage with steady gait.

## 2017-10-12 NOTE — ED Notes (Signed)
Pt states unable to urinate at this time. 

## 2017-10-17 LAB — CULTURE, BLOOD (ROUTINE X 2)
Culture: NO GROWTH
Culture: NO GROWTH
Special Requests: ADEQUATE
Special Requests: ADEQUATE

## 2017-11-29 ENCOUNTER — Ambulatory Visit: Payer: Self-pay | Admitting: Family Medicine

## 2017-11-30 ENCOUNTER — Ambulatory Visit: Payer: Self-pay | Admitting: Family Medicine

## 2017-12-01 ENCOUNTER — Ambulatory Visit: Payer: Self-pay | Admitting: Family Medicine

## 2017-12-05 ENCOUNTER — Encounter: Payer: Self-pay | Admitting: Family Medicine

## 2017-12-05 ENCOUNTER — Ambulatory Visit (INDEPENDENT_AMBULATORY_CARE_PROVIDER_SITE_OTHER): Payer: Self-pay | Admitting: Family Medicine

## 2017-12-05 VITALS — BP 108/77 | HR 76 | Resp 17 | Ht 69.5 in | Wt 214.6 lb

## 2017-12-05 DIAGNOSIS — R202 Paresthesia of skin: Secondary | ICD-10-CM

## 2017-12-05 DIAGNOSIS — Z7689 Persons encountering health services in other specified circumstances: Secondary | ICD-10-CM

## 2017-12-05 DIAGNOSIS — R531 Weakness: Secondary | ICD-10-CM

## 2017-12-05 DIAGNOSIS — R200 Anesthesia of skin: Secondary | ICD-10-CM

## 2017-12-05 DIAGNOSIS — R2681 Unsteadiness on feet: Secondary | ICD-10-CM

## 2017-12-05 DIAGNOSIS — F1411 Cocaine abuse, in remission: Secondary | ICD-10-CM

## 2017-12-05 MED ORDER — GABAPENTIN 300 MG PO CAPS: 300.0000 mg | ORAL_CAPSULE | Freq: Three times a day (TID) | ORAL | 3 refills | Status: AC

## 2017-12-05 MED ORDER — MELOXICAM 15 MG PO TABS: 15.0000 mg | ORAL_TABLET | Freq: Every day | ORAL | 0 refills | Status: DC

## 2017-12-05 NOTE — Progress Notes (Signed)
Camilo Mander, is a 47 y.o. male  ZOX:[id number]  WJX:[id number]  DOB - 07-02-1970  CC:  Chief Complaint  Patient presents with  . Establish Care  . Numbness    numbness & tingling in his hands/feet x 2 months. having frequent falls. states it started after taking 30+ multivitamins in 24 hours       HPI: Omarius is a 47 y.o. male is here today to establish care.   Carol Ada does not have a problem list on file.   Today's visit:  TEAGUE GOYNES is here to establish care. Patient was seen at [facility], ER on 12/12/2017 with multiple complaints following being transferred from [facility].  He was transferred after reporting that he had acute paresthesias, inability to stand without feeling weakness, and a worsening broad-based gait.  Patient has a history of substance abuse.  Although he attributes this recent neurological abnormality to taken multiple DME vitamins however this occurred over 3 months ago.  He underwent an MRI of the brain thoracic and lumbar spine which were all unremarkable. He was discharge and recommended for outpatient neurology consult if symptoms persisted.  Patient is uninsured. Today he continues to complain of gait instability and some weakness.  He has not sustained any recent falls.  He continues to have some intermittent numbness and tingling bilaterally of his lower extremities. He denies any known B12 deficiencies or thyroid dysfunction.  CK level was normal during recent ER visit.   Denies any known family history of MS, lupus, sarcoidosis, rheumatoid arthritis, or Parkinson's.  Current medications:No current outpatient medications on file.   Pertinent family medical history: family history includes Diabetes in his mother; Stroke in his mother.   No Known Allergies  Social History   Socioeconomic History  . Marital status: Single    Spouse name: Not on file  . Number of children: Not on file  . Years of education: Not on  file  . Highest education level: Not on file  Occupational History  . Not on file  Social Needs  . Financial resource strain: Not on file  . Food insecurity:    Worry: Not on file    Inability: Not on file  . Transportation needs:    Medical: Not on file    Non-medical: Not on file  Tobacco Use  . Smoking status: Current Every Day Smoker    Packs/day: 0.15  . Smokeless tobacco: Never Used  Substance and Sexual Activity  . Alcohol use: Yes    Alcohol/week: 6.0 standard drinks    Types: 6 Cans of beer per week  . Drug use: No  . Sexual activity: Not on file  Lifestyle  . Physical activity:    Days per week: Not on file    Minutes per session: Not on file  . Stress: Not on file  Relationships  . Social connections:    Talks on phone: Not on file    Gets together: Not on file    Attends religious service: Not on file    Active member of club or organization: Not on file    Attends meetings of clubs or organizations: Not on file    Relationship status: Not on file  . Intimate partner violence:    Fear of current or ex partner: Not on file    Emotionally abused: Not on file    Physically abused: Not on file    Forced sexual activity: Not on file  Other Topics  Concern  . Not on file  Social History Narrative  . Not on file   Review of Systems: Constitutional: Negative for fever, chills, diaphoresis, activity change, appetite change and fatigue. Eyes: Negative for pain, discharge, redness, itching and visual disturbance. Respiratory: Negative for cough, choking, chest tightness, shortness of breath, wheezing and stridor.  Cardiovascular: Negative for chest pain, palpitations and leg swelling. Musculoskeletal: See HPI Neurological: See HPI.  Hematological: Negative for adenopathy. Does not bruise/bleed easily. Psychiatric/Behavioral: Negative for hallucinations, behavioral problems, confusion, dysphoric mood, decreased concentration and agitation.    Objective:    Vitals:   12/05/17 1410  BP: 108/77  Pulse: 76  Resp: 17  SpO2: 96%    BP Readings from Last 3 Encounters:  12/05/17 108/77  10/12/17 118/79  10/08/17 (!) 117/59    Filed Weights   12/05/17 1410  Weight: 214 lb 9.6 oz (97.3 kg)     Physical Exam: Constitutional: Patient appears well-developed and well-nourished. No distress. HENT: Normocephalic, atraumatic, External right and left ear normal. Oropharynx is clear and moist.  Eyes: Conjunctivae and EOM are normal. PERRLA, no scleral icterus. Neck: Normal ROM. Neck supple. No JVD. No tracheal deviation. No thyromegaly. CVS: RRR, S1/S2 +, no murmurs, no gallops, no carotid bruit.  Pulmonary: Effort and breath sounds normal, no stridor, rhonchi, wheezes, rales.  Abdominal: Soft. BS +, no distension, tenderness, rebound or guarding.  Musculoskeletal: Normal range of motion. No edema and no tenderness.  Neuro: Alert.  Antalgic gait present. 5/5 bilateral lower extremity strength.  Dorsi and plantar flexion intact.  +2 talar reflexes.  No visible cranial nerve deficits noted on exam Skin: Skin is warm and dry. No rash noted. Not diaphoretic. No erythema. No pallor. Psychiatric: Normal mood and affect. Behavior, judgment, thought content normal.  Lab Results (prior encounters)  Lab Results  Component Value Date   WBC 10.1 10/12/2017   HGB 15.3 10/12/2017   HCT 45.0 10/12/2017   MCV 90.9 10/12/2017   PLT 306 10/12/2017   Lab Results  Component Value Date   CREATININE 1.29 (H) 10/12/2017   BUN 16 10/12/2017   NA 139 10/12/2017   K 3.9 10/12/2017   CL 107 10/12/2017   CO2 24 10/12/2017      Assessment and plan:  1. Encounter to establish care 2. Numbness and tingling of both lower extremities 3. Numbness and tingling of both upper extremities Will trial a course of gabapentin 300 mg 3 times daily daily for neuropathic pain meloxicam 50 mg once daily..  We will also check a CBC with differential, vitamin B12, hemoglobin  A1c and thyroid panel to rule out any metabolic cause to current symptoms.  4. Gait instability, unknown etiology.  Advised patient if there are no gross abnormalities noted in current blood work I would recommend physical therapy to regain strength and improve gait mobility.  Uninsured and was advised to pick up a financial assistance packet upon discharge today.  5. Hx of cocaine abuse (HCC),  Encourage cessation and advised patient to consider substance abuse counseling.  He reports that he is no longer using drugs.   Meds ordered this encounter  Medications  . meloxicam (MOBIC) 15 MG tablet    Sig: Take 1 tablet (15 mg total) by mouth daily.    Dispense:  30 tablet    Refill:  0  . gabapentin (NEURONTIN) 300 MG capsule    Sig: Take 1 capsule (300 mg total) by mouth 3 (three) times daily.    Dispense:  90 capsule    Refill:  3    Orders Placed This Encounter  Procedures  . CBC with Differential  . Vitamin B12  . Hemoglobin A1c  . Thyroid Panel With TSH  . Ambulatory referral to Physical Therapy    Referral Priority:   Routine    Referral Type:   Physical Medicine    Referral Reason:   Specialty Services Required    Requested Specialty:   Physical Therapy    Number of Visits Requested:   3      Return in about 2 months (around 02/04/2018).   The patient was given clear instructions to go to ER or return to medical center if symptoms don't improve, worsen or new problems develop. The patient verbalized understanding. The patient was advised  to call and obtain lab results if they haven't heard anything from out office within 7-10 business days.  Joaquin Courts, FNP Primary Care at [facility] [address] [place] [address] [phone]: [phone]    This note has been created with Dragon speech recognition software and Paediatric nurse. Any transcriptional errors are unintentional.

## 2017-12-05 NOTE — Patient Instructions (Addendum)
Thank you for choosing [facility]!    Jonathon AdaLawrence J Dickerson was seen by Jonathon CourtsKimberly Harris, FNP today.   Jonathon RearLawrence J Dickerson'Dickerson primary care provider is Jonathon NeighborsHarris, Jonathon S, FNP.   For the best care possible, you should try to see Jonathon CourtsKimberly Harris, FNP-C whenever you come to the clinic.   We look forward to seeing you again soon!  If you have any questions about your visit today, please call us at [phone] or feel free to reach your primary care provider via MyChart.          Paresthesia Paresthesia is a burning or prickling feeling. This feeling can happen in any part of the body. It often happens in the hands, arms, legs, or feet. Usually, it is not painful. In most cases, the feeling goes away in a short time and is not a sign of a serious problem. Follow these instructions at home:  Avoid drinking alcohol.  Try massage or needle therapy (acupuncture) to help with your problems.  Keep all follow-up visits as told by your doctor. This is important. Contact a doctor if:  You keep on having episodes of paresthesia.  Your burning or prickling feeling gets worse when you walk.  You have pain or cramps.  You feel dizzy.  You have a rash. Get help right away if:  You feel weak.  You have trouble walking or moving.  You have problems speaking, understanding, or seeing.  You feel confused.  You cannot control when you pee (urinate) or poop (bowel movement).  You lose feeling (numbness) after an injury.  You pass out (faint). This information is not intended to replace advice given to you by your health care provider. Make sure you discuss any questions you have with your health care provider. Document Released: 12/10/2007 Document Revised: 06/04/2015 Document Reviewed: 12/23/2013 Elsevier Interactive Patient Education  [employer]2018 Elsevier Inc.

## 2017-12-06 LAB — THYROID PANEL WITH TSH
FREE THYROXINE INDEX: 1.9 (ref 1.2–4.9)
T3 Uptake Ratio: 31 % (ref 24–39)
T4, Total: 6.1 ug/dL (ref 4.5–12.0)
TSH: 0.83 u[IU]/mL (ref 0.450–4.500)

## 2017-12-06 LAB — CBC WITH DIFFERENTIAL/PLATELET
Basophils Absolute: 0.1 10*3/uL (ref 0.0–0.2)
Basos: 1 %
EOS (ABSOLUTE): 0.3 10*3/uL (ref 0.0–0.4)
Eos: 3 %
Hematocrit: 40.9 % (ref 37.5–51.0)
Hemoglobin: 14 g/dL (ref 13.0–17.7)
IMMATURE GRANS (ABS): 0 10*3/uL (ref 0.0–0.1)
Immature Granulocytes: 0 %
LYMPHS: 31 %
Lymphocytes Absolute: 2.6 10*3/uL (ref 0.7–3.1)
MCH: 30.5 pg (ref 26.6–33.0)
MCHC: 34.2 g/dL (ref 31.5–35.7)
MCV: 89 fL (ref 79–97)
MONOCYTES: 7 %
Monocytes Absolute: 0.6 10*3/uL (ref 0.1–0.9)
NEUTROS PCT: 58 %
Neutrophils Absolute: 4.8 10*3/uL (ref 1.4–7.0)
PLATELETS: 369 10*3/uL (ref 150–450)
RBC: 4.59 x10E6/uL (ref 4.14–5.80)
RDW: 13.1 % (ref 12.3–15.4)
WBC: 8.4 10*3/uL (ref 3.4–10.8)

## 2017-12-06 LAB — HEMOGLOBIN A1C
ESTIMATED AVERAGE GLUCOSE: 123 mg/dL
Hgb A1c MFr Bld: 5.9 % — ABNORMAL HIGH (ref 4.8–5.6)

## 2017-12-06 LAB — VITAMIN B12: Vitamin B-12: 261 pg/mL (ref 232–1245)

## 2017-12-11 ENCOUNTER — Encounter: Payer: Self-pay | Admitting: Family Medicine

## 2017-12-11 NOTE — Progress Notes (Signed)
Mail lab letter  

## 2017-12-12 ENCOUNTER — Telehealth: Payer: Self-pay | Admitting: Family Medicine

## 2017-12-12 DIAGNOSIS — R200 Anesthesia of skin: Secondary | ICD-10-CM | POA: Insufficient documentation

## 2017-12-12 DIAGNOSIS — F1411 Cocaine abuse, in remission: Secondary | ICD-10-CM | POA: Insufficient documentation

## 2017-12-12 DIAGNOSIS — R202 Paresthesia of skin: Secondary | ICD-10-CM

## 2017-12-12 NOTE — Telephone Encounter (Signed)
Contact patient to advise that his labs did not reveal any underlying cause as to why he is having gait abnormalities along with the numbness and weakness.  However his lab work was significant for prediabetes with an A1c of 5.9.  At this point I do not recommend putting him on metformin however I do recommend dietary and lifestyle changes with engage in routine physical exercise as tolerated as well as eating a balanced diet which is rich in vegetables and protein and minimizing intake of high carb sugary type foods and drinks.  Also I would like for him to start physical therapy however he will need to complete the financial assistance paperwork that he picked up during his last office visit he can get that turned in.  I will go ahead and placed a referral for physical therapy so that he can get an appointment established.  He should keep his next follow-up appointment with me.

## 2017-12-13 NOTE — Telephone Encounter (Signed)
Attempted to call number on file. Received error message "the number you have dialed is not a working number".

## 2017-12-15 NOTE — Telephone Encounter (Signed)
Attempted to call number on file. Received error message "the number you have dialed is not a working number".  Mailed "unable to contact" letter.

## 2018-01-24 ENCOUNTER — Other Ambulatory Visit: Payer: Self-pay | Admitting: Family Medicine

## 2018-01-24 NOTE — Telephone Encounter (Signed)
Rx refill request- patient seen at [facility]

## 2018-01-25 ENCOUNTER — Other Ambulatory Visit: Payer: Self-pay

## 2018-01-25 ENCOUNTER — Telehealth: Payer: Self-pay | Admitting: Family Medicine

## 2018-01-25 ENCOUNTER — Ambulatory Visit: Payer: BLUE CROSS/BLUE SHIELD | Attending: Family Medicine

## 2018-01-25 DIAGNOSIS — R2689 Other abnormalities of gait and mobility: Secondary | ICD-10-CM | POA: Insufficient documentation

## 2018-01-25 NOTE — Therapy (Signed)
[facility] [address] [place], [place], [address] Phone: [phone]   Fax:  [phone]  Physical Therapy Evaluation  Patient Details  Name: Jonathon Dickerson MRN: [id number] Date of Birth: 1970/05/23 No data recorded  Encounter Date: 01/25/2018  PT End of Session - 01/25/18 1605    Visit Number  1   no charge for visit   Number of Visits  1    Date for PT Re-Evaluation  01/25/18    Authorization Type  BCBS: [facility] plan    PT Start Time  1546   pt late   PT Stop Time  1600    PT Time Calculation (min)  14 min    Behavior During Therapy  [facility] ManagementWFL for tasks assessed/performed       Past Medical History:  Diagnosis Date  . Anxiety   . Bipolar disorder (HCC)   . Depression     History reviewed. No pertinent surgical history.  There were no vitals filed for this visit.   Subjective Assessment - 01/25/18 1604    Subjective  No charge for visit, as it was determined pt's insurance did not cover visit and pt did not wish to continue (eval would have been no charge) but he didn't want to go through an eval here and at [facility] (in network). Pt stated he could not afford f/u visit costs at our location.                     Objective measurements completed on examination: See above findings.                           Plan - 01/25/18 1606    Clinical Impression Statement  No charge.        Patient will benefit from skilled therapeutic intervention in order to improve the following deficits and impairments:     Visit Diagnosis: Other abnormalities of gait and mobility     Problem List Patient Active Problem List   Diagnosis Date Noted  . Hx of cocaine abuse (HCC) 12/12/2017  . Numbness and tingling of both lower extremities 12/12/2017  . Numbness and tingling of both upper extremities 12/12/2017    Susan Arana L 01/25/2018, 4:07 PM  [facility] [address] [place], [place], [address] Phone: [phone]   Fax:  [phone]  Name: Jonathon Dickerson MRN: [id number] Date of Birth: 1970/05/23  Zerita BoersJennifer Alwyn Cordner, PT,DPT 01/25/18 4:07 PM Phone: [phone] Fax: [phone]

## 2018-01-25 NOTE — Telephone Encounter (Signed)
Jonathon Dickerson,  This patient was referred to [facility] for [facility] however his insurance is out of network for [facility].  His health insurance is in network for [facility].  We reached out to patient and inquire if he has transportation either to [facility] or [place] and I will be happy to place a referral for physical therapy for gait instability and lower leg weakness.  Joaquin CourtsKimberly Bonny Egger, FNP

## 2018-01-26 NOTE — Telephone Encounter (Signed)
I spoke to patient to ask him where he wants to go and he told me that  he wants to see a Neurologist and wait for PT

## 2018-01-26 NOTE — Telephone Encounter (Signed)
Gm  Please, place a new referral thank you

## 2018-01-27 NOTE — Telephone Encounter (Signed)
Referral placed for [facility]  Neurology

## 2018-01-27 NOTE — Addendum Note (Signed)
Addended by: Bing Neighbors on: 01/27/2018 03:07 PM   Modules accepted: Orders

## 2018-02-05 ENCOUNTER — Ambulatory Visit: Payer: Self-pay | Admitting: Family Medicine

## 2018-02-15 ENCOUNTER — Ambulatory Visit: Payer: BLUE CROSS/BLUE SHIELD | Admitting: Neurology

## 2018-02-16 ENCOUNTER — Ambulatory Visit: Payer: Self-pay | Admitting: Family Medicine

## 2018-02-19 ENCOUNTER — Encounter: Payer: Self-pay | Admitting: Neurology

## 2018-02-19 ENCOUNTER — Ambulatory Visit: Payer: BLUE CROSS/BLUE SHIELD | Admitting: Neurology

## 2018-02-19 ENCOUNTER — Telehealth: Payer: Self-pay | Admitting: Neurology

## 2018-02-19 VITALS — BP 121/74 | HR 79 | Ht 69.0 in | Wt 217.0 lb

## 2018-02-19 DIAGNOSIS — R270 Ataxia, unspecified: Secondary | ICD-10-CM | POA: Diagnosis not present

## 2018-02-19 DIAGNOSIS — R159 Full incontinence of feces: Secondary | ICD-10-CM

## 2018-02-19 DIAGNOSIS — M4802 Spinal stenosis, cervical region: Secondary | ICD-10-CM

## 2018-02-19 DIAGNOSIS — R252 Cramp and spasm: Secondary | ICD-10-CM | POA: Diagnosis not present

## 2018-02-19 DIAGNOSIS — G959 Disease of spinal cord, unspecified: Secondary | ICD-10-CM | POA: Diagnosis not present

## 2018-02-19 DIAGNOSIS — R200 Anesthesia of skin: Secondary | ICD-10-CM | POA: Diagnosis not present

## 2018-02-19 DIAGNOSIS — G992 Myelopathy in diseases classified elsewhere: Secondary | ICD-10-CM

## 2018-02-19 MED ORDER — ALPRAZOLAM 0.25 MG PO TABS: ORAL_TABLET | ORAL | 0 refills | Status: DC

## 2018-02-19 NOTE — Addendum Note (Signed)
Addended by: Naomie Dean B on: 02/19/2018 10:53 AM   Modules accepted: Orders

## 2018-02-19 NOTE — Telephone Encounter (Signed)
MR Cervical spine wo contrast Dr. Valaria Good Auth: [id number] (exp. 02/19/18 to 03/20/18). Patient is scheduled at [facility] for 02/20/18

## 2018-02-19 NOTE — Progress Notes (Addendum)
[facility]    Provider:  Dr Lucia GaskinsAhern Referring Provider: Bing NeighborsHarris, Kimberly S, FNP Primary Care Provider:  Bing NeighborsHarris, Kimberly S, FNP  CC:  Numbness and tingling of BLE and Hands.  HPI:  Jonathon AdaLawrence J Dickerson is a 48 y.o. male here as requested by provider Bing NeighborsHarris, Kimberly S, FNP for . PMHx migraine, depression, bipolar, substance abuse. In September he went to the emergency woke up not being able to walk, he has numbness in the legs and feet and in the arms, his chest is numb feels like a tightness and in the arms. Not improving. No face or head symptoms. Difficulty walking. Having progressive falls. Imbalance. Numbness. Bowel incontinnce, has had bowel movements on himself. He is progressive. No pain. He is having fine motor dififcuties in the hands. He can't sit, he slides off, cntral ataxia, can't keep his trunk straight, progressive weakness from the neck down. He took 21 multivitamins the night prior. He also has low B12 and polysubstance abuse.   Reviewed notes, labs and imaging from outside physicians, which showed: personally reviewed images and agree wit the following.   MRI brain 10/12/2017: 1. Largely unremarkable noncontrast brain MRI without evidence of acute intracranial abnormality. 2. Single focus of T2 hyperintensity in right frontal periventricular white matter, nonspecific and quite likely of no clinical significance.  MRI Thoracic: IMPRESSION: MR THORACIC SPINE IMPRESSION  1. Unremarkable appearance of the thoracic spine including thoracic spinal cord. 2. Partially visualized cervical spondylosis including potentially significant spinal stenosis at C4-5. Consider cervical spine MRI for further evaluation.   MR LUMBAR SPINE IMPRESSION  Minimal lumbar spondylosis without evidence of neural impingement.  B12 261  Review of Systems: Patient complains of symptoms per HPI as well as the following symptoms: insomnia, snoring, restless legs, depression,  anxiety, decreased energy, joint pain and swelling, cramps, aching muscles, . Pertinent negatives and positives per HPI. All others negative.   Social History   Socioeconomic History  . Marital status: Single    Spouse name: Not on file  . Number of children: 3  . 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 Highest education level: Some college, no degree  Occupational History  . Not on file  Social Needs  . Financial resource strain: Not on file  . Food insecurity:    Worry: Not on file    Inability: Not on file  . Transportation needs:    Medical: Not on file    Non-medical: Not on file  Tobacco Use  . Smoking status: Current Some Day Smoker    Packs/day: 0.15  . Smokeless tobacco: Former NeurosurgeonUser    Types: Snuff    Quit date: 2000  . Tobacco comment: once a week  Substance and Sexual Activity  . Alcohol use: Yes    Alcohol/week: 3.0 - 4.0 standard drinks    Types: 3 - 4 Standard drinks or equivalent per week  . Drug use: Yes    Types: Cocaine, Marijuana    Comment: 02/19/2018- last cocaine use 4-5 days ago  . Sexual activity: Not on file  Lifestyle  . Physical activity:    Days per week: Not on file    Minutes per session: Not on file  . Stress: Not on file  Relationships  . Social connections:    Talks on phone: Not on file    Gets together: Not on file    Attends religious service: Not on file    Active member of club  or organization: Not on file    Attends meetings of clubs or organizations: Not on file    Relationship status: Not on file  . Intimate partner violence:    Fear of current or ex partner: Not on file    Emotionally abused: Not on file    Physically abused: Not on file    Forced sexual activity: Not on file  Other Topics Concern  . Not on file  Social History Narrative   Lives at home with his dad   Right handed   Caffeine: "every now and then"    Family History  Problem Relation Age of Onset  .  Diabetes Mother   . Stroke Mother   . Bipolar disorder Sister   . Bipolar disorder Brother   . Bipolar disorder Maternal Grandmother     Past Medical History:  Diagnosis Date  . Anxiety   . Bipolar disorder (HCC)   . Depression   . Migraine     Patient Active Problem List   Diagnosis Date Noted  . 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 Hx of cocaine abuse (HCC) 12/12/2017  . Numbness and tingling of both lower extremities 12/12/2017  . Numbness and tingling of both upper extremities 12/12/2017    Past Surgical History:  Procedure Laterality Date  . NO PAST SURGERIES      Current Outpatient Medications  Medication Sig Dispense Refill  . gabapentin (NEURONTIN) 300 MG capsule Take 1 capsule (300 mg total) by mouth 3 (three) times daily. 90 capsule 3  . ibuprofen (ADVIL,MOTRIN) 200 MG tablet Take 600 mg by mouth 2 (two) times daily as needed.    Marland Kitchen. MAGNESIUM PO Take by mouth daily.    Marland Kitchen. VITAMIN D PO Take by mouth daily.    Marland Kitchen. ALPRAZolam (XANAX) 0.25 MG tablet Take 1-2 tabs (0.25mg -0.50mg ) 30-60 minutes before procedure. May repeat if needed.Do not drive. 4 tablet 0  . meloxicam (MOBIC) 15 MG tablet TAKE 1 TABLET BY MOUTH ONCE DAILY (Patient not taking: Reported on 01/25/2018) 30 tablet 0   No current facility-administered medications for this visit.     Allergies as of 02/19/2018 - Review Complete 02/19/2018  Allergen Reaction Noted  . Other Shortness Of Breath and Swelling 02/19/2018    Vitals: BP 121/74 (BP Location: Right Arm, Patient Position: Sitting)   Pulse 79   Ht 5\' 9"  (1.753 m)   Wt 217 lb (98.4 kg)   BMI 32.05 kg/m  Last Weight:  Wt Readings from Last 1 Encounters:  02/19/18 217 lb (98.4 kg)   Last Height:   Ht Readings from Last 1 Encounters:  02/19/18 5\' 9"  (1.753 m)     Physical exam: Exam: Gen: NAD, conversant, well nourised, well groomed                     CV: RRR, no MRG. No Carotid Bruits. No peripheral edema, warm, nontender Eyes:  Conjunctivae clear without exudates or hemorrhage  Neuro: Detailed Neurologic Exam  Speech:    Speech is normal; fluent and spontaneous with normal comprehension.  Cognition:    The patient is oriented to person, place, and time;     recent and remote memory intact;     language fluent;     normal attention, concentration,     fund of knowledge Cranial Nerves:    The pupils are equal, round, and reactive to light. The fundi are normal and spontaneous venous pulsations are present. Visual fields are  full to finger confrontation. Extraocular movements are intact. Trigeminal sensation is intact and the muscles of mastication are normal. The face is symmetric. The palate elevates in the midline. Hearing intact. Voice is normal. Shoulder shrug is normal. The tongue has normal motion without fasciculations.   Coordination:    Normal finger to nose and heel to shin. Normal rapid alternating movements.   Gait:    Wide based and ataxic  Motor Observation: right triceps 4+/5, very weak grip and interossei, bilat leg flexion 3/5.  Tone: Increased tone right arm.       Posture:    Posture is normal. normal erect    Strength:    Strength is V/V in the upper and lower limbs.      Sensation: intact to LT     Reflex Exam:  DTR's: brisk right UE with spreading, brisk right LE, clonus at Ajs bilateral.     Toes:    Right upgoing Clonus:    Clonus is absent.   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  Per thoracic report: Cervical spondylosis is evident on large field-of-view sagittal counting images with potentially significant spinal stenosis and cord flattening at C4-5. Needs dedicated MRI cervical spine stat given myelopathy and bowel incontinence.   If this is myelopathy in cervical spine he needs asap surgery will [place]ia [place]  B12 261 needs supplementation, discussed with patient  Orders Placed This Encounter  Procedures  . MR CERVICAL SPINE WO CONTRAST    Standing Status:   Future    Number of Occurrences:   1    Standing Expiration Date:   08/20/2019    Order Specific Question:   What is the patient's sedation requirement?    Answer:   No Sedation    Order Specific Question:   Does the patient have a pacemaker or implanted devices?    Answer:   No    Order Specific Question:   Preferred imaging location?    Answer:   External    Order Specific Question:   Radiology Contrast Protocol - do NOT remove file path    Answer:   [email or web address]  . Ambulatory referral to Neurosurgery    Referral Priority:   Urgent    Referral Type:   Surgical    Referral Reason:   Specialty Services Required    Referred to Provider:   Maeola Harman, MD    Requested Specialty:   Neurosurgery    Number of Visits Requested:   1    Cc: Bing Neighbors, FNP,    Naomie Dean, MD  [facility] [address] [place], [place] [address]  Phone [phone] Fax [phone]

## 2018-02-20 ENCOUNTER — Other Ambulatory Visit: Payer: BLUE CROSS/BLUE SHIELD

## 2018-02-20 NOTE — Telephone Encounter (Signed)
Just and FYI patient called and r/s his appointment for tomorrow.

## 2018-02-21 ENCOUNTER — Ambulatory Visit: Payer: BLUE CROSS/BLUE SHIELD

## 2018-02-21 DIAGNOSIS — R270 Ataxia, unspecified: Secondary | ICD-10-CM

## 2018-02-21 DIAGNOSIS — R200 Anesthesia of skin: Secondary | ICD-10-CM

## 2018-02-21 DIAGNOSIS — R252 Cramp and spasm: Secondary | ICD-10-CM

## 2018-02-21 DIAGNOSIS — R159 Full incontinence of feces: Secondary | ICD-10-CM

## 2018-02-21 DIAGNOSIS — G959 Disease of spinal cord, unspecified: Secondary | ICD-10-CM

## 2018-02-23 ENCOUNTER — Telehealth: Payer: Self-pay | Admitting: Neurology

## 2018-02-23 ENCOUNTER — Other Ambulatory Visit: Payer: Self-pay | Admitting: Neurology

## 2018-02-23 DIAGNOSIS — M5000 Cervical disc disorder with myelopathy, unspecified cervical region: Secondary | ICD-10-CM

## 2018-02-23 NOTE — Telephone Encounter (Addendum)
Called patient, spoke to patient . he needs surgery for severe cervical stenosis with myelopathy. Sending urgent referral.. In the meantime left message that patient is to go to the ED for any red flags or changes.  Abnormal MRI cervical spine (without) demonstrating: - At C4-5: broad disc bulging, uncovertebral joint hypertrophy and facet hypertrophy with severe spinal cord compression and T2 hyperintense signal within the cord; severe biforaminal stenosis. - At C5-6: disc bulging and facet hypertrophy with moderate spinal stenosis, moderate right and severe left foraminal stenosis

## 2018-02-24 NOTE — Addendum Note (Signed)
Addended by: Naomie Dean B on: 02/24/2018 08:42 AM   Modules accepted: Orders

## 2018-02-26 ENCOUNTER — Other Ambulatory Visit: Payer: Self-pay | Admitting: Neurology

## 2018-02-26 ENCOUNTER — Telehealth: Payer: Self-pay | Admitting: Neurology

## 2018-02-26 DIAGNOSIS — G959 Disease of spinal cord, unspecified: Secondary | ICD-10-CM

## 2018-02-26 NOTE — Telephone Encounter (Signed)
I called [facility] and they asked me to include the following in referral fax, demographics, referral, notes, OV notes, and imaging notes, insurance card. I have faxed all of this to [phone] and they will have their on call look at it today. Their phone number is [phone]. I called to make the patient aware but he did not answer so I left him a VM asking him to call me back.

## 2018-02-26 NOTE — Telephone Encounter (Signed)
I will call pt I have a copy of CD. It will be @ front desk for pt.

## 2018-02-26 NOTE — Telephone Encounter (Signed)
Pt called and states that he was referred to Dr. Venetia Maxon. But Dr. Venetia Maxon called back and said that they do not take his Insurance. Pt has [employer] [place] Please advise.

## 2018-02-26 NOTE — Telephone Encounter (Signed)
Pt is wanting to be referred to [facility]

## 2018-02-26 NOTE — Telephone Encounter (Signed)
I spoke with the patient and advised that Dr. Lucia Gaskins placed the referral for [facility] neurosurgery and he will need to bring a CD of his MRI to that appointment when he gets scheduled. Advised pt that [facility] is likely where he needs to get it but I will send a message to Stanton Kidney to let her know. We will get back in touch with him about where to pick up his CD. Patient verbalized appreciation and understanding. He is aware that he will receive a separate call from [facility] to schedule his appt after the referral has been sent and processed.

## 2018-02-26 NOTE — Telephone Encounter (Signed)
Let him know I have placed the referral but he will have to bring a CD with his mri cervical spine imaging on it to appointment as I am not sure they have access to his imaging since they are [facility]. He can go to [facility] (I thnk he had it done here) and ask for a disk thanks

## 2018-02-26 NOTE — Telephone Encounter (Signed)
Noted thank you

## 2018-02-26 NOTE — Telephone Encounter (Signed)
error 

## 2020-01-20 ENCOUNTER — Encounter (HOSPITAL_COMMUNITY): Payer: Self-pay | Admitting: Student

## 2020-01-20 ENCOUNTER — Emergency Department (HOSPITAL_COMMUNITY): Payer: Self-pay

## 2020-01-20 ENCOUNTER — Inpatient Hospital Stay (HOSPITAL_COMMUNITY)
Admission: EM | Admit: 2020-01-20 | Discharge: 2020-01-28 | DRG: 177 | Disposition: A | Discharge status: Home / Self Care | Payer: Self-pay | Attending: Internal Medicine | Admitting: Internal Medicine

## 2020-01-20 DIAGNOSIS — E860 Dehydration: Secondary | ICD-10-CM | POA: Diagnosis present

## 2020-01-20 DIAGNOSIS — G4700 Insomnia, unspecified: Secondary | ICD-10-CM | POA: Diagnosis present

## 2020-01-20 DIAGNOSIS — F1410 Cocaine abuse, uncomplicated: Secondary | ICD-10-CM | POA: Diagnosis present

## 2020-01-20 DIAGNOSIS — Z79899 Other long term (current) drug therapy: Secondary | ICD-10-CM

## 2020-01-20 DIAGNOSIS — F319 Bipolar disorder, unspecified: Secondary | ICD-10-CM | POA: Diagnosis present

## 2020-01-20 DIAGNOSIS — J9601 Acute respiratory failure with hypoxia: Secondary | ICD-10-CM | POA: Diagnosis present

## 2020-01-20 DIAGNOSIS — D72829 Elevated white blood cell count, unspecified: Secondary | ICD-10-CM

## 2020-01-20 DIAGNOSIS — T50901A Poisoning by unspecified drugs, medicaments and biological substances, accidental (unintentional), initial encounter: Secondary | ICD-10-CM

## 2020-01-20 DIAGNOSIS — R4182 Altered mental status, unspecified: Secondary | ICD-10-CM

## 2020-01-20 DIAGNOSIS — Z791 Long term (current) use of non-steroidal anti-inflammatories (NSAID): Secondary | ICD-10-CM

## 2020-01-20 DIAGNOSIS — F419 Anxiety disorder, unspecified: Secondary | ICD-10-CM | POA: Diagnosis present

## 2020-01-20 DIAGNOSIS — Z818 Family history of other mental and behavioral disorders: Secondary | ICD-10-CM

## 2020-01-20 DIAGNOSIS — R9431 Abnormal electrocardiogram [ECG] [EKG]: Secondary | ICD-10-CM | POA: Diagnosis present

## 2020-01-20 DIAGNOSIS — N179 Acute kidney failure, unspecified: Secondary | ICD-10-CM | POA: Diagnosis present

## 2020-01-20 DIAGNOSIS — Z91030 Bee allergy status: Secondary | ICD-10-CM

## 2020-01-20 DIAGNOSIS — F17210 Nicotine dependence, cigarettes, uncomplicated: Secondary | ICD-10-CM | POA: Diagnosis present

## 2020-01-20 DIAGNOSIS — G9341 Metabolic encephalopathy: Secondary | ICD-10-CM | POA: Diagnosis present

## 2020-01-20 DIAGNOSIS — E876 Hypokalemia: Secondary | ICD-10-CM | POA: Diagnosis present

## 2020-01-20 DIAGNOSIS — Z09 Encounter for follow-up examination after completed treatment for conditions other than malignant neoplasm: Secondary | ICD-10-CM

## 2020-01-20 DIAGNOSIS — J690 Pneumonitis due to inhalation of food and vomit: Principal | ICD-10-CM | POA: Diagnosis present

## 2020-01-20 DIAGNOSIS — T40601A Poisoning by unspecified narcotics, accidental (unintentional), initial encounter: Secondary | ICD-10-CM | POA: Diagnosis present

## 2020-01-20 DIAGNOSIS — Z20822 Contact with and (suspected) exposure to covid-19: Secondary | ICD-10-CM | POA: Diagnosis present

## 2020-01-20 LAB — CBC WITH DIFFERENTIAL/PLATELET
Abs Immature Granulocytes: 0.07 10*3/uL (ref 0.00–0.07)
Basophils Absolute: 0 10*3/uL (ref 0.0–0.1)
Basophils Relative: 0 %
Eosinophils Absolute: 0.1 10*3/uL (ref 0.0–0.5)
Eosinophils Relative: 1 %
HCT: 37.4 % — ABNORMAL LOW (ref 39.0–52.0)
Hemoglobin: 12 g/dL — ABNORMAL LOW (ref 13.0–17.0)
Immature Granulocytes: 1 %
Lymphocytes Relative: 9 %
Lymphs Abs: 1.2 10*3/uL (ref 0.7–4.0)
MCH: 30.2 pg (ref 26.0–34.0)
MCHC: 32.1 g/dL (ref 30.0–36.0)
MCV: 94.2 fL (ref 80.0–100.0)
Monocytes Absolute: 0.6 10*3/uL (ref 0.1–1.0)
Monocytes Relative: 4 %
Neutro Abs: 12.5 10*3/uL — ABNORMAL HIGH (ref 1.7–7.7)
Neutrophils Relative %: 85 %
Platelets: 297 10*3/uL (ref 150–400)
RBC: 3.97 MIL/uL — ABNORMAL LOW (ref 4.22–5.81)
RDW: 13.6 % (ref 11.5–15.5)
WBC: 14.5 10*3/uL — ABNORMAL HIGH (ref 4.0–10.5)
nRBC: 0 % (ref 0.0–0.2)

## 2020-01-20 LAB — COMPREHENSIVE METABOLIC PANEL
ALT: 20 U/L (ref 0–44)
AST: 28 U/L (ref 15–41)
Albumin: 3.7 g/dL (ref 3.5–5.0)
Alkaline Phosphatase: 77 U/L (ref 38–126)
Anion gap: 10 (ref 5–15)
BUN: 13 mg/dL (ref 6–20)
CO2: 25 mmol/L (ref 22–32)
Calcium: 8.4 mg/dL — ABNORMAL LOW (ref 8.9–10.3)
Chloride: 100 mmol/L (ref 98–111)
Creatinine, Ser: 1.11 mg/dL (ref 0.61–1.24)
GFR, Estimated: >60 mL/min (ref >60)
Glucose, Bld: 203 mg/dL — ABNORMAL HIGH (ref 70–99)
Potassium: 3.4 mmol/L — ABNORMAL LOW (ref 3.5–5.1)
Sodium: 135 mmol/L (ref 135–145)
Total Bilirubin: 0.4 mg/dL (ref 0.3–1.2)
Total Protein: 7 g/dL (ref 6.5–8.1)

## 2020-01-20 LAB — RESP PANEL BY RT-PCR (FLU A&B, COVID) ARPGX2
Influenza A by PCR: NEGATIVE
Influenza B by PCR: NEGATIVE
SARS Coronavirus 2 by RT PCR: NEGATIVE

## 2020-01-20 LAB — ETHANOL: Alcohol, Ethyl (B): <10 mg/dL (ref <10)

## 2020-01-20 MED ORDER — SODIUM CHLORIDE 0.9 % IV SOLN
500.0000 mg | Freq: Once | INTRAVENOUS | Status: AC
  Administered 2020-01-20: 500 mg via INTRAVENOUS
  Filled 2020-01-20: qty 500

## 2020-01-20 MED ORDER — SODIUM CHLORIDE 0.9 % IV SOLN
1.0000 g | Freq: Once | INTRAVENOUS | Status: AC
  Administered 2020-01-20: 1 g via INTRAVENOUS
  Filled 2020-01-20: qty 10

## 2020-01-20 MED ORDER — NALOXONE HCL 4 MG/10ML IJ SOLN
0.2500 mg/h | INTRAVENOUS | Status: DC
  Administered 2020-01-20: 0.25 mg/h via INTRAVENOUS
  Filled 2020-01-20: qty 10

## 2020-01-20 NOTE — ED Triage Notes (Signed)
Patient BIB [facility] for overdose on cocaine and an unknown pill.  Found in hotel room unresponsive around 1530 by a male friend, last known well 1445. Patient was given 1 mg IV narcan given.  20g right AC placed by EMS.  Vitals were  98% on room air 215 CBG 107/60 100-HR 41f NPA in right nare

## 2020-01-20 NOTE — ED Provider Notes (Addendum)
[facility]-EMERGENCY DEPT Provider Note   CSN: [id number] Arrival date & time: 01/20/20  1627     History No chief complaint on file.   Jonathon Dickerson is a 50 y.o. male.  Patient is a 50 year old male with a history of bipolar disorder, depression, cocaine abuse who is presenting today with EMS for altered mental status.  Somebody present with the patient who called 911 gave the history.  She reported that he was fine and she left and an hour later when she came back he was barely breathing and not able to be woken up.  She did report that he used cocaine today which she does quite frequently but also took a pill of an unknown substance.  She denied him injecting anything.  EMS reported they initially had to bag him for approximately 15 minutes and then gave him 1 mg of Narcan total.  He did become combative and agitated requiring restraint and over the last 45 minutes has slowly become more somnolent.  He had a strong pulse the entire time and no CPR was ever required.  No further history is available at this time.  The history is provided by the EMS personnel, a friend and medical records.       Past Medical History:  Diagnosis Date  . Anxiety   . Bipolar disorder (HCC)   . Depression   . Migraine     Patient Active Problem List   Diagnosis Date Noted  . 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 Hx of cocaine abuse (HCC) 12/12/2017  . Numbness and tingling of both lower extremities 12/12/2017  . Numbness and tingling of both upper extremities 12/12/2017    Past Surgical History:  Procedure Laterality Date  . NO PAST SURGERIES         Family History  Problem Relation Age of Onset  . Diabetes Mother   . Stroke Mother   . Bipolar disorder Sister   . Bipolar disorder Brother   . Bipolar disorder Maternal Grandmother     Social History   Tobacco Use  . Smoking status: Current Some Day Smoker    Packs/day: 0.15  . Smokeless tobacco:  Former Neurosurgeon    Types: Snuff    Quit date: 2000  . Tobacco comment: once a week  Vaping Use  . Vaping Use: Never used  Substance Use Topics  . Alcohol use: Yes    Alcohol/week: 3.0 - 4.0 standard drinks    Types: 3 - 4 Standard drinks or equivalent per week  . Drug use: Yes    Types: Cocaine, Marijuana    Comment: 02/19/2018- last cocaine use 4-5 days ago    Home Medications Prior to Admission medications   Medication Sig Start Date End Date Taking? Authorizing Provider  ALPRAZolam (XANAX) 0.25 MG tablet Take 1-2 tabs (0.25mg -0.50mg ) 30-60 minutes before procedure. May repeat if needed.Do not drive. 02/19/18   Anson Fret, MD  gabapentin (NEURONTIN) 300 MG capsule Take 1 capsule (300 mg total) by mouth 3 (three) times daily. 12/05/17   Bing Neighbors, FNP  ibuprofen (ADVIL,MOTRIN) 200 MG tablet Take 600 mg by mouth 2 (two) times daily as needed.    [provider]  MAGNESIUM PO Take by mouth daily.    [provider]  meloxicam (MOBIC) 15 MG tablet TAKE 1 TABLET BY MOUTH ONCE DAILY Patient not taking: Reported on 01/25/2018 01/25/18   Bing Neighbors, FNP  VITAMIN D PO Take by  mouth daily.    [provider]    Allergies    Other  Review of Systems   Review of Systems  Unable to perform ROS: Mental status change    Physical Exam Updated Vital Signs BP 125/80   Pulse 85   Temp (!) 97.4 F (36.3 C) (Oral)   Resp 10   SpO2 100%   Physical Exam Vitals and nursing note reviewed.  Constitutional:      General: He is not in acute distress.    Appearance: He is well-developed and well-nourished.     Comments: Patient is somnolent but with speaking loudly and shaking his arm he will open his eyes and he is able to tell me his name before he goes directly back to sleep  HENT:     Head: Normocephalic and atraumatic.     Mouth/Throat:     Mouth: Oropharynx is clear and moist.  Eyes:     Extraocular Movements: EOM normal.      Conjunctiva/sclera: Conjunctivae normal.     Comments: Pinpoint pupils bilaterally  Cardiovascular:     Rate and Rhythm: Regular rhythm. Tachycardia present.     Pulses: Intact distal pulses.     Heart sounds: No murmur heard.   Pulmonary:     Effort: Pulmonary effort is normal. No respiratory distress.     Breath sounds: Rales present. No wheezing.     Comments: Fine crackles noted at the right lung base.  Sonorous respirations Abdominal:     General: There is no distension.     Palpations: Abdomen is soft.     Tenderness: There is no abdominal tenderness. There is no guarding or rebound.  Musculoskeletal:        General: No tenderness. Normal range of motion.     Cervical back: Normal range of motion and neck supple.     Right lower leg: Edema present.     Left lower leg: Edema present.     Comments: Moving all extremities.  No notable track marks  Skin:    General: Skin is warm and dry.     Findings: No erythema or rash.  Neurological:     Mental Status: He is lethargic.     Comments: Oriented to self only.  Not following commands.  Constantly mumbling incoherently  Psychiatric:        Mood and Affect: Mood and affect normal.     Comments: Altered mental status at this time due to intoxication of unknown substance      ED Results / Procedures / Treatments   Labs (all labs ordered are listed, but only abnormal results are displayed) Labs Reviewed  CBC WITH DIFFERENTIAL/PLATELET - Abnormal; Notable for the following components:      Result Value   WBC 14.5 (*)    RBC 3.97 (*)    Hemoglobin 12.0 (*)    HCT 37.4 (*)    Neutro Abs 12.5 (*)    All other components within normal limits  COMPREHENSIVE METABOLIC PANEL - Abnormal; Notable for the following components:   Potassium 3.4 (*)    Glucose, Bld 203 (*)    Calcium 8.4 (*)    All other components within normal limits  RESP PANEL BY RT-PCR (FLU A&B, COVID) [id number]  ETHANOL    EKG None ED ECG REPORT   Date:  01/20/2020  Rate: 98  Rhythm: normal sinus rhythm  QRS Axis: normal  Intervals: QT prolonged  ST/T Wave abnormalities: normal  Conduction Disutrbances:none  Narrative Interpretation:   Old EKG Reviewed: unchanged  I have personally reviewed the EKG tracing and agree with the computerized printout as noted.  Radiology DG Chest Port 1 View  Result Date: 01/20/2020 CLINICAL DATA:  50 year old male with hypoxia. EXAM: PORTABLE CHEST 1 VIEW COMPARISON:  Chest radiograph dated 10/16/2008. FINDINGS: Right lung base hazy densities may represent atelectasis or atypical infiltrate. Clinical correlation is recommended. No focal consolidation, pleural effusion, pneumothorax. The cardiac silhouette is within limits. No acute osseous pathology. Cervical spine ACDF. IMPRESSION: Right lung base atelectasis versus atypical infiltrate. Electronically Signed   By: Elgie CollardArash  Radparvar M.D.   On: 01/20/2020 17:28    Procedures Procedures (including critical care time)  Medications Ordered in ED Medications  naloxone HCl (NARCAN) 4 mg in dextrose 5 % 250 mL infusion (has no administration in time range)    ED Course  I have reviewed the triage vital signs and the nursing notes.  Pertinent labs & imaging results that were available during my care of the patient were reviewed by me and considered in my medical decision making (see chart for details).    MDM Rules/Calculators/A&P                          Patient presenting today with EMS for what appears to be an unintentional overdose of an unknown substance.  There was report of patient using cocaine but also an unknown pill.  Suspect this is an opiate as patient did become responsive after receiving Narcan.  Initially he was agonal with low O2 sats.  Patient has slowly become more obtunded over the last 45 minutes since receiving the Narcan.  He will open his eyes and tell me his name briefly and then goes back to sleep.  Sats of approximately 88% was  placed on 2 L of oxygen.  Patient's pupils are still pinpoint at this time.  CBG is 215.  He is mildly tachycardic and blood pressure is stable.  Will start patient on a Narcan drip given ongoing somnolence.  Labs and covid pending.  CXR pending.  6:46 PM CXR with some atelectasis and CBC with mild leukocytosis, Covid negative, CMP without acute findingsAnd alcohol is less than 10.  Patient still requiring Narcan drip to maintain oxygen saturation and respiratory rate.  Will admit for further care.  9:38 PM Able to turn off Narcan drip inpatient is still able to be aroused.  He is still altered.  Still feel that it is related to ingestion.  We will do a head CT to rule out other acute issues.  Patient does wake up to stimulation.  Given white count of 14,000, wet cough with foul-looking sputum and chest x-ray with possible atypical infection will cover with Rocephin and azithromycin.  We will plan on admission to the hospitalist as patient is currently on 4 L nasal cannula and maintaining oxygen saturation for now greater than 1 hour.  Critical care is happy to consult as needed.  MDM Number of Diagnoses or Management Options   Amount and/or Complexity of Data Reviewed Clinical lab tests: ordered and reviewed Tests in the radiology section of CPT: ordered and reviewed Tests in the medicine section of CPT: ordered and reviewed Decide to obtain previous medical records or to obtain history from someone other than the patient: yes Obtain history from someone other than the patient: yes Review and summarize past medical records: yes Discuss the patient with other providers: yes Independent visualization  of images, tracings, or specimens: yes  Risk of Complications, Morbidity, and/or Mortality Presenting problems: high Diagnostic procedures: moderate Management options: moderate  Patient Progress Patient progress: improved  CRITICAL CARE Performed by: Xiomara Sevillano Total critical care  time: 30 minutes Critical care time was exclusive of separately billable procedures and treating other patients. Critical care was necessary to treat or prevent imminent or life-threatening deterioration. Critical care was time spent personally by me on the following activities: development of treatment plan with patient and/or surrogate as well as nursing, discussions with consultants, evaluation of patient's response to treatment, examination of patient, obtaining history from patient or surrogate, ordering and performing treatments and interventions, ordering and review of laboratory studies, ordering and review of radiographic studies, pulse oximetry and re-evaluation of patient's condition.  Final Clinical Impression(s) / ED Diagnoses Final diagnoses:  Opiate overdose, accidental or unintentional, initial encounter (HCC)  Altered mental status, unspecified altered mental status type    Rx / DC Orders ED Discharge Orders    None       Gwyneth Sprout, MD 01/20/20 Kermit Balo, MD 01/20/20 2139

## 2020-01-21 ENCOUNTER — Encounter (HOSPITAL_COMMUNITY): Payer: Self-pay | Admitting: Family Medicine

## 2020-01-21 DIAGNOSIS — R4182 Altered mental status, unspecified: Secondary | ICD-10-CM

## 2020-01-21 DIAGNOSIS — D72829 Elevated white blood cell count, unspecified: Secondary | ICD-10-CM

## 2020-01-21 DIAGNOSIS — R9431 Abnormal electrocardiogram [ECG] [EKG]: Secondary | ICD-10-CM

## 2020-01-21 DIAGNOSIS — T50901A Poisoning by unspecified drugs, medicaments and biological substances, accidental (unintentional), initial encounter: Secondary | ICD-10-CM

## 2020-01-21 DIAGNOSIS — J690 Pneumonitis due to inhalation of food and vomit: Principal | ICD-10-CM

## 2020-01-21 LAB — CBC
HCT: 40.3 % (ref 39.0–52.0)
HCT: 40.8 % (ref 39.0–52.0)
Hemoglobin: 13 g/dL (ref 13.0–17.0)
Hemoglobin: 13.1 g/dL (ref 13.0–17.0)
MCH: 30.1 pg (ref 26.0–34.0)
MCH: 30.3 pg (ref 26.0–34.0)
MCHC: 32.1 g/dL (ref 30.0–36.0)
MCHC: 32.3 g/dL (ref 30.0–36.0)
MCV: 93.8 fL (ref 80.0–100.0)
MCV: 93.9 fL (ref 80.0–100.0)
Platelets: 315 10*3/uL (ref 150–400)
Platelets: 329 10*3/uL (ref 150–400)
RBC: 4.29 MIL/uL (ref 4.22–5.81)
RBC: 4.35 MIL/uL (ref 4.22–5.81)
RDW: 13.7 % (ref 11.5–15.5)
RDW: 13.8 % (ref 11.5–15.5)
WBC: 6.6 10*3/uL (ref 4.0–10.5)
WBC: 6.6 10*3/uL (ref 4.0–10.5)
nRBC: 0 % (ref 0.0–0.2)
nRBC: 0 % (ref 0.0–0.2)

## 2020-01-21 LAB — MRSA PCR SCREENING: MRSA by PCR: NEGATIVE

## 2020-01-21 LAB — BASIC METABOLIC PANEL
Anion gap: 10 (ref 5–15)
BUN: 16 mg/dL (ref 6–20)
CO2: 25 mmol/L (ref 22–32)
Calcium: 8.8 mg/dL — ABNORMAL LOW (ref 8.9–10.3)
Chloride: 102 mmol/L (ref 98–111)
Creatinine, Ser: 1.64 mg/dL — ABNORMAL HIGH (ref 0.61–1.24)
GFR, Estimated: 51 mL/min — ABNORMAL LOW (ref >60)
Glucose, Bld: 82 mg/dL (ref 70–99)
Potassium: 4.1 mmol/L (ref 3.5–5.1)
Sodium: 137 mmol/L (ref 135–145)

## 2020-01-21 LAB — CREATININE, SERUM
Creatinine, Ser: 1.68 mg/dL — ABNORMAL HIGH (ref 0.61–1.24)
GFR, Estimated: 50 mL/min — ABNORMAL LOW (ref >60)

## 2020-01-21 LAB — HIV ANTIBODY (ROUTINE TESTING W REFLEX): HIV Screen 4th Generation wRfx: NONREACTIVE

## 2020-01-21 LAB — PROCALCITONIN: Procalcitonin: 28.09 ng/mL

## 2020-01-21 MED ORDER — ORAL CARE MOUTH RINSE
15.0000 mL | Freq: Two times a day (BID) | OROMUCOSAL | Status: DC
  Administered 2020-01-21 – 2020-01-28 (×10): 15 mL via OROMUCOSAL

## 2020-01-21 MED ORDER — IPRATROPIUM-ALBUTEROL 0.5-2.5 (3) MG/3ML IN SOLN: 3.0000 mL | Freq: Four times a day (QID) | RESPIRATORY_TRACT | Status: DC | PRN

## 2020-01-21 MED ORDER — ACETAMINOPHEN 325 MG PO TABS
650.0000 mg | ORAL_TABLET | Freq: Four times a day (QID) | ORAL | Status: DC | PRN
  Administered 2020-01-22 – 2020-01-27 (×5): 650 mg via ORAL
  Filled 2020-01-21 (×5): qty 2

## 2020-01-21 MED ORDER — ACETAMINOPHEN 650 MG RE SUPP: 650.0000 mg | Freq: Four times a day (QID) | RECTAL | Status: DC | PRN

## 2020-01-21 MED ORDER — ENOXAPARIN SODIUM 40 MG/0.4ML ~~LOC~~ SOLN
40.0000 mg | SUBCUTANEOUS | Status: DC
  Administered 2020-01-21 – 2020-01-28 (×8): 40 mg via SUBCUTANEOUS
  Filled 2020-01-21 (×8): qty 0.4

## 2020-01-21 MED ORDER — NALOXONE HCL 0.4 MG/ML IJ SOLN
0.4000 mg | INTRAMUSCULAR | Status: DC | PRN
  Administered 2020-01-21: 0.4 mg via INTRAVENOUS
  Filled 2020-01-21: qty 1

## 2020-01-21 MED ORDER — CHLORHEXIDINE GLUCONATE CLOTH 2 % EX PADS
6.0000 | MEDICATED_PAD | Freq: Every day | CUTANEOUS | Status: DC
  Administered 2020-01-21 – 2020-01-23 (×3): 6 via TOPICAL

## 2020-01-21 MED ORDER — SENNOSIDES-DOCUSATE SODIUM 8.6-50 MG PO TABS: 1.0000 | ORAL_TABLET | Freq: Every evening | ORAL | Status: DC | PRN

## 2020-01-21 MED ORDER — NALOXONE HCL 4 MG/10ML IJ SOLN
0.5000 mg/h | INTRAVENOUS | Status: DC
  Filled 2020-01-21: qty 10

## 2020-01-21 MED ORDER — SODIUM CHLORIDE 0.9 % IV SOLN
3.0000 g | Freq: Three times a day (TID) | INTRAVENOUS | Status: AC
  Administered 2020-01-21 – 2020-01-27 (×21): 3 g via INTRAVENOUS
  Filled 2020-01-21: qty 0.15
  Filled 2020-01-21 (×4): qty 3
  Filled 2020-01-21 (×2): qty 8
  Filled 2020-01-21 (×3): qty 3
  Filled 2020-01-21: qty 0.15
  Filled 2020-01-21: qty 8
  Filled 2020-01-21 (×2): qty 0.15
  Filled 2020-01-21 (×2): qty 3
  Filled 2020-01-21 (×3): qty 8
  Filled 2020-01-21: qty 0.15
  Filled 2020-01-21: qty 3
  Filled 2020-01-21: qty 8
  Filled 2020-01-21: qty 3

## 2020-01-21 MED ORDER — LACTATED RINGERS IV SOLN: INTRAVENOUS | Status: DC

## 2020-01-21 NOTE — H&P (Signed)
History and Physical    MARTYN TIMME ZOX:[id number] DOB: [birth year] DOA: 01/20/2020  PCP: Bing Neighbors, FNP   Patient coming from: Home  Chief Complaint: altered mental status  HPI: Jonathon Dickerson is a 50 y.o. male with medical history significant for bipolar disorder and cocaine abuse who presents by EMS for altered mental status.  Patient was very somnolent when he arrived in the emergency room.  He was given Narcan which improved his alertness for a short time but then reverted back to somnolent. History is obtained from provider who got it from a girlfriend who showed up with the patient and had called 911. She reported that he was fine and she left and an hour later when she came back he was barely breathing and not able to be woken up. She did report that he used cocaine today which he does quite frequently but also took a pill of an unknown substance.  She denied him injecting anything.  EMS reported they initially had to bag him for approximately 15 minutes and then gave him 1 mg of Narcan total. He did become combative and agitated requiring restraint after the narcan but slowly become more somnolent.  He had a strong pulse the entire time and no CPR was ever required.  No further history is available at this time.  In the emergency room he was placed on a Narcan infusion for short time.  ER provider discussed with PCCM who did not think a narcan infusion was necessary and it was stopped.  Patient is now very somnolent and will open eyes but not verbally respond to tactile stimulation.  Review of Systems:  Unable to obtain review of systems secondary to acute medical condition of being somnolent Past Medical History:  Diagnosis Date  . Anxiety   . Bipolar disorder (HCC)   . Depression   . Migraine     Past Surgical History:  Procedure Laterality Date  . NO PAST SURGERIES      Social History  reports that he has been smoking. He has been smoking about 0.15 packs per  day. He quit smokeless tobacco use about 22 years ago.  His smokeless tobacco use included snuff. He reports current alcohol use of about 3.0 - 4.0 standard drinks of alcohol per week. He reports current drug use. Drugs: Cocaine and Marijuana.  Allergies  Allergen Reactions  . Hornet Venom Anaphylaxis    Pt reported allergy to all bees.  . Other Shortness Of Breath and Swelling    BEES    Family History  Problem Relation Age of Onset  . Diabetes Mother   . Stroke Mother   . Bipolar disorder Sister   . Bipolar disorder Brother   . Bipolar disorder Maternal Grandmother      Prior to Admission medications   Medication Sig Start Date End Date Taking? Authorizing Provider  ALPRAZolam (XANAX) 0.25 MG tablet Take 1-2 tabs (0.25mg -0.50mg ) 30-60 minutes before procedure. May repeat if needed.Do not drive. 02/19/18   Anson Fret, MD  gabapentin (NEURONTIN) 300 MG capsule Take 1 capsule (300 mg total) by mouth 3 (three) times daily. 12/05/17   Bing Neighbors, FNP  meloxicam (MOBIC) 15 MG tablet TAKE 1 TABLET BY MOUTH ONCE DAILY 01/25/18   Bing Neighbors, FNP    Physical Exam: Vitals:   01/21/20 0000 01/21/20 0015 01/21/20 0030 01/21/20 0045  BP: (!) 109/93 (!) 114/94 119/85 (!) 128/113  Pulse: (!) 111 (!) 112 (!) 114 Marland Kitchen)  114  Resp: (!) 6 (!) 7 (!) 6 (!) 7  Temp:      TempSrc:      SpO2: 100% 100% 100% 100%    Constitutional: NAD, calm, comfortable Vitals:   01/21/20 0000 01/21/20 0015 01/21/20 0030 01/21/20 0045  BP: (!) 109/93 (!) 114/94 119/85 (!) 128/113  Pulse: (!) 111 (!) 112 (!) 114 (!) 114  Resp: (!) 6 (!) 7 (!) 6 (!) 7  Temp:      TempSrc:      SpO2: 100% 100% 100% 100%   General: WDWN, somnolent.  Will open eyes briefly to tactile stimulation but does not verbally respond Eyes: PERRL, l conjunctivae normal. No drainage. Sclera nonicteric HENT:  [place]/AT, external ears normal.  Nares patent without epistasis.  Mucous membranes are dry Neck: Soft, normal range  of motion, supple, no masses, no thyromegaly.  Trachea midline Respiratory: Diminished breath sounds in right base with crackles and diffuse rales.  Patient has loud snoring with wet lung sounds nothing obtained with suction. no wheezing, Normal respiratory effort. No accessory muscle use.  Cardiovascular: Sinus tachycardia, no murmurs / rubs / gallops. No extremity edema. Abdomen: Soft, nondistended, no rebound or guarding.  No masses palpated. No hepatosplenomegaly. Bowel sounds normoactive Musculoskeletal: FROM. no cyanosis. No joint deformity upper and lower extremities. Normal muscle tone.  Skin: Warm, dry, intact no rashes, lesions, ulcers. No induration Neurologic: patella DTR +1 bilaterally.  Withdraws from painful stimuli Psychiatric: Normal judgment and insight.  Normal mood.    Labs on Admission: I have personally reviewed following labs and imaging studies  CBC: Recent Labs  Lab 01/20/20 1642  WBC 14.5*  NEUTROABS 12.5*  HGB 12.0*  HCT 37.4*  MCV 94.2  PLT 297    Basic Metabolic Panel: Recent Labs  Lab 01/20/20 1642  NA 135  K 3.4*  CL 100  CO2 25  GLUCOSE 203*  BUN 13  CREATININE 1.11  CALCIUM 8.4*    GFR: CrCl cannot be calculated (Unknown ideal weight.).  Liver Function Tests: Recent Labs  Lab 01/20/20 1642  AST 28  ALT 20  ALKPHOS 77  BILITOT 0.4  PROT 7.0  ALBUMIN 3.7    Urine analysis:    Component Value Date/Time   COLORURINE YELLOW 10/12/2017 0345   APPEARANCEUR CLEAR 10/12/2017 0345   LABSPEC >1.030 (H) 10/12/2017 0345   PHURINE 5.5 10/12/2017 0345   GLUCOSEU NEGATIVE 10/12/2017 0345   HGBUR SMALL (A) 10/12/2017 0345   BILIRUBINUR NEGATIVE 10/12/2017 0345   KETONESUR NEGATIVE 10/12/2017 0345   PROTEINUR NEGATIVE 10/12/2017 0345   NITRITE NEGATIVE 10/12/2017 0345   LEUKOCYTESUR NEGATIVE 10/12/2017 0345    Radiological Exams on Admission: CT Head Wo Contrast  Result Date: 01/20/2020 CLINICAL DATA:  50 year old male with  altered mental status. EXAM: CT HEAD WITHOUT CONTRAST TECHNIQUE: Contiguous axial images were obtained from the base of the skull through the vertex without intravenous contrast. COMPARISON:  None. FINDINGS: Brain: The ventricles and sulci appropriate size for patient's age. The gray-white matter discrimination is preserved. There is no acute intracranial hemorrhage. No mass effect or midline shift. No extra-axial fluid collection. Vascular: No hyperdense vessel or unexpected calcification. Skull: Normal. Negative for fracture or focal lesion. Sinuses/Orbits: Mild mucoperiosteal thickening of paranasal sinuses. No air-fluid level. The mastoid air cells are clear. Other: None IMPRESSION: Unremarkable noncontrast CT of the brain. Electronically Signed   By: Elgie Collard M.D.   On: 01/20/2020 23:26   DG Chest [facility] 1 View  Result  Date: 01/20/2020 CLINICAL DATA:  50 year old male with hypoxia. EXAM: PORTABLE CHEST 1 VIEW COMPARISON:  Chest radiograph dated 10/16/2008. FINDINGS: Right lung base hazy densities may represent atelectasis or atypical infiltrate. Clinical correlation is recommended. No focal consolidation, pleural effusion, pneumothorax. The cardiac silhouette is within limits. No acute osseous pathology. Cervical spine ACDF. IMPRESSION: Right lung base atelectasis versus atypical infiltrate. Electronically Signed   By: Elgie Collard M.D.   On: 01/20/2020 17:28    EKG: Independently reviewed.  KG shows normal sinus rhythm with no acute ST elevation or depression.  QTc is prolonged at 506  Assessment/Plan Principal Problem:   Aspiration pneumonia  Mr. Blasingame is admitted to step down unit. He has RLL pneumonia on CXR which is suspected due to aspiration from AMS and somnolence from suspected drug overdose. She is placed on Unasyn for antibiotic coverage for aspiration pneumonia.  He was given Rocephin and azithromycin in the emergency room but Unasyn has better coverage of anaerobic  microorganisms with aspiration.  Antibiotic choice was discussed with pharmacist and she agrees with Unasyn Supplemental oxygen as needed to keep O2 sat between 92-96%. Incentive spirometer every 2 hours while awake and or flutter valve Monitor CBC, electrolytes renal function in morning. Fluid hydration with LR overnight  Active Problems:   Drug overdose UDS was not obtained initially in the emergency room.  I have ordered a UDS to try and determine what patient took. Patient was given multiple doses of Narcan and he would wake up and become alert for a brief time and then go back into a lethargic somnolent state.  Patient was placed for a brief, Narcan infusion emergency room but after ER provider discussed with PCCM Narcan infusion was stopped.  A little while later patient was not arousable and given another dose of Narcan which reversed his somnolence and he became alert and talkative    Leukocytosis Check CBC in morning    AMS (altered mental status) Secondary to suspected drug overdose.  Altered mental status reverses with Narcan.  Patient be monitored closely.  Every 4 hours neuro checks ordered through the night    Prolonged QT interval Medications which are further prolong QT interval.    DVT prophylaxis: Lovenox for DVT prophylaxis. Code Status:   Full code  Family Communication:  No family present. Pt is lethargic and has AMS Disposition Plan:   Patient is from:  Home  Anticipated DC to:  Home  Anticipated DC date:  Anticipate more than 2 midnight stay in the hospital  Anticipated DC barriers:   Admission status:   Inpatient  Claudean Severance Jobie Popp MD [facility]  How to contact the [facility] Attending or Consulting provider 7A - 7P or covering provider during after hours 7P -7A, for this patient?   1. Check the care team in [facility] and look for a) attending/consulting [facility] provider listed and b) the [facility] team listed 2. Log into [email or web address] and use [place]'s universal  password to access. If you do not have the password, please contact the hospital operator. 3. Locate the [facility] provider you are looking for under [facility] and page to a number that you can be directly reached. 4. If you still have difficulty reaching the provider, please page the [facility] (Director on Call) for the Hospitalists listed on amion for assistance.  01/21/2020, 1:28 AM

## 2020-01-21 NOTE — Progress Notes (Signed)
   Patient seen and examined at bedside, patient admitted after midnight, please see earlier detailed admission note by Carlton Adam, MD. Briefly, patient presented secondary to altered mental status secondary to aspiration pneumonia and drug overdose, likely opiate as he has had response to Narcan treatment.   Subjective: No issues per patient. He states he snorted cocaine but did not take any other medication.  BP 112/70   Pulse (!) 108   Temp 98.9 F (37.2 C) (Oral)   Resp (!) 9   SpO2 93%   General exam: Appears calm HEENT: bilateral eyes are pinpoint and minimally reactive Respiratory system: Significant rhonchi in upper left airway. Respiratory effort normal. Cardiovascular system: S1 & S2 heard, RRR. No murmurs, rubs, gallops or clicks. Gastrointestinal system: Abdomen is nondistended, soft and nontender. No organomegaly or masses felt. Normal bowel sounds heard. Central nervous system: Lethargic but easily arouses and falls back asleep. Oriented to person and place. Answers questions appropriately when aroused. Musculoskeletal: No edema. No calf tenderness Skin: No cyanosis. No rashes Psychiatry: Judgement and insight appear impaired.   Brief assessment/Plan:  Aspiration pneumonia Secondary to lethargy from opiate overdose. Alertness improved today and it appears patient will be able to protect airway. Started on Unasyn IV for treatment. Afebrile. No leukocytosis. -Continue Unasyn IV, oxygen as needed -Obtain procalcitonin  Opiate overdose Discussed with PCCM again with concern for worsening oxygen use in addition to what appears to be continued unintentional opiate intoxication/overdose. At this time, continued recommendation to hold narcan drip and treat with prn Narcan as needed  Acute respiratory failure with hypoxia Secondary to opiate overdose. -Oxygen therapy to keep O2 saturations 92-99%   Family communication: None at bedside DVT prophylaxis:  Lovenox Disposition: Discharge home likely in 1-2 days pending wean to room air, improved mental status  Jacquelin Hawking, MD [facility] 01/21/2020, 8:23 AM

## 2020-01-21 NOTE — Progress Notes (Addendum)
Daughter, Cathlean Sauer, is taking pt's wallet home with her. At bedside is pt's phone, charger, clothing and necklace

## 2020-01-21 NOTE — ED Notes (Signed)
Called report to [place], Charity fundraiser

## 2020-01-21 NOTE — Evaluation (Signed)
SLP Cancellation Note  Patient Details Name: Jonathon Dickerson MRN: [id number] DOB: 24-Mar-1970   Cancelled treatment:       Reason Eval/Treat Not Completed: Other (comment) (pt lethargic at this time, requested RN contact me if pt alert)  Rolena Infante, MS [facility] SLP Acute Rehab Services Office [phone] Pager [phone]   Chales Abrahams 01/21/2020, 9:39 AM

## 2020-01-22 DIAGNOSIS — J9601 Acute respiratory failure with hypoxia: Secondary | ICD-10-CM

## 2020-01-22 LAB — RAPID URINE DRUG SCREEN, HOSP PERFORMED
Amphetamines: POSITIVE — AB
Barbiturates: NOT DETECTED
Benzodiazepines: NOT DETECTED
Cocaine: NOT DETECTED
Opiates: NOT DETECTED
Tetrahydrocannabinol: NOT DETECTED

## 2020-01-22 LAB — PROCALCITONIN: Procalcitonin: 54.28 ng/mL

## 2020-01-22 LAB — BASIC METABOLIC PANEL
Anion gap: 13 (ref 5–15)
BUN: 16 mg/dL (ref 6–20)
CO2: 23 mmol/L (ref 22–32)
Calcium: 8.4 mg/dL — ABNORMAL LOW (ref 8.9–10.3)
Chloride: 101 mmol/L (ref 98–111)
Creatinine, Ser: 1.07 mg/dL (ref 0.61–1.24)
GFR, Estimated: >60 mL/min (ref >60)
Glucose, Bld: 81 mg/dL (ref 70–99)
Potassium: 4.1 mmol/L (ref 3.5–5.1)
Sodium: 137 mmol/L (ref 135–145)

## 2020-01-22 NOTE — TOC Initial Note (Signed)
Transition of Care [facility]) - Initial/Assessment Note    Patient Details  Name: Jonathon Dickerson MRN: [id number] Date of Birth: 07/20/1970  Transition of Care [facility]) CM/SW Contact:    Golda Acre, RN Phone Number: 01/22/2020, 9:17 AM  Clinical Narrative:                 50 y.o. male with medical history significant for bipolar disorder and cocaine abuse who presents by EMS for altered mental status.  Patient was very somnolent when he arrived in the emergency room.  He was given Narcan which improved his alertness for a short time but then reverted back to somnolent. History is obtained from provider who got it from a girlfriend who showed up with the patient and had called 911. She reported that he was fine and she left and an hour later when she came back he was barely breathing and not able to be woken up.She did report that he used cocaine today which he does quite frequently but also took a pill of an unknown substance. She denied him injecting anything. EMS reported they initially had to bag him for approximately 15 minutes and then gave him 1 mg of Narcan total. He did become combative and agitated requiring restraint after the narcan but slowly become more somnolent. He had a strong pulse the entire time and no CPR was ever required. No further history is available at this time.  In the emergency room he was placed on a Narcan infusion for short time.  ER provider discussed with PCCM who did not think a narcan infusion was necessary and it was stopped.  Patient is now very somnolent and will open eyes but not verbally respond to tactile stimulation. Progression [id number] alert and reactive Plan to return to home will need substance abuse resources. Expected Discharge Plan: Home/Self Care Barriers to Discharge: Continued Medical Work up   Patient Goals and CMS Choice Patient states their goals for this hospitalization and ongoing recovery are:: to go home CMS Medicare.gov Compare  Post Acute Care list provided to:: Patient    Expected Discharge Plan and Services Expected Discharge Plan: Home/Self Care   Discharge Planning Services: CM Consult   Living arrangements for the past 2 months: Single Family Home                                      Prior Living Arrangements/Services Living arrangements for the past 2 months: Single Family Home Lives with:: Self Patient language and need for interpreter reviewed:: Yes Do you feel safe going back to the place where you live?: Yes      Need for Family Participation in Patient Care: Yes (Comment) (daughter) Care giver support system in place?: Yes (comment)   Criminal Activity/Legal Involvement Pertinent to Current Situation/Hospitalization: No - Comment as needed  Activities of Daily Living Home Assistive Devices/Equipment: None ADL Screening (condition at time of admission) Patient's cognitive ability adequate to safely complete daily activities?: Yes Is the patient deaf or have difficulty hearing?: No Does the patient have difficulty seeing, even when wearing glasses/contacts?: No Does the patient have difficulty concentrating, remembering, or making decisions?: Yes Patient able to express need for assistance with ADLs?: Yes Does the patient have difficulty dressing or bathing?: No Independently performs ADLs?: Yes (appropriate for developmental age) Does the patient have difficulty walking or climbing stairs?: No Weakness of Legs: Both (numbness and  tingling in lower extremities) Weakness of Arms/Hands: Both (numbness and tingling in upper extremities)  Permission Sought/Granted                  Emotional Assessment Appearance:: Appears stated age Attitude/Demeanor/Rapport: Engaged Affect (typically observed): Calm Orientation: : Oriented to Place,Oriented to Self,Oriented to  Time,Oriented to Situation Alcohol / Substance Use: Not Applicable Psych Involvement: No (comment)  Admission  diagnosis:  Aspiration pneumonia (HCC) [J69.0] Opiate overdose, accidental or unintentional, initial encounter (HCC) [T40.601A] Altered mental status, unspecified altered mental status type [R41.82] Patient Active Problem List   Diagnosis Date Noted  . Aspiration pneumonia (HCC) 01/21/2020  . Drug overdose 01/21/2020  . Leukocytosis 01/21/2020  . AMS (altered mental status) 01/21/2020  . Prolonged QT interval 01/21/2020  . 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 Hx of cocaine abuse (HCC) 12/12/2017  . Numbness and tingling of both lower extremities 12/12/2017  . Numbness and tingling of both upper extremities 12/12/2017   PCP:  Bing Neighbors, FNP Pharmacy:   [facility] [address]), [place] - [address] [place] ([place]) [place] [address] Phone: [phone] Fax: [phone]  [facility] [address] [place] - [address]  [place] [address] Phone: [phone] Fax: [phone]     Social Determinants of Health (SDOH) Interventions    Readmission Risk Interventions No flowsheet data found.

## 2020-01-22 NOTE — Progress Notes (Signed)
PROGRESS NOTE    Jonathon Dickerson  QZR:[id number] DOB: Nov 15, 1970 DOA: 01/20/2020 PCP: Bing Neighbors, FNP   Brief Narrative: Jonathon Dickerson is a 50 y.o. male with a history of bipolar disorder and cocaine abuse. He presented secondary to respiratory failure from unintentional likely opiate overdose requiring multiple doses of Narcan and Narcan drip.    Assessment & Plan:   Principal Problem:   Aspiration pneumonia (HCC) Active Problems:   Drug overdose   Leukocytosis   AMS (altered mental status)   Prolonged QT interval   Aspiration pneumonia Secondary to lethargy from opiate overdose. Started on Unasyn IV for treatment. Afebrile. No leukocytosis. Lung exam significantly improved from yesterday. Procalcitonin elevated and increased from yesterday. Afebrile. -Continue Unasyn IV, oxygen as needed -Trend procalcitonin  Opiate overdose Discussed with PCCM again with concern for worsening oxygen use in addition to what appears to be continued unintentional opiate intoxication/overdose. At this time, continued recommendation to hold narcan drip and treat with prn Narcan as needed  Acute respiratory failure with hypoxia Secondary to opiate overdose and aspiration. -Oxygen therapy to keep O2 saturations 92-99%  AKI Baseline creatinine of 1.1. Creatinine of 1.11 on admission with peak of 1.68. Now resolved with IV fluids.   DVT prophylaxis: Lovenox Code Status:   Code Status: Full Code Family Communication: None at bedside Disposition Plan: Discharge home likely in several days pending ability to wean patient's oxygen down.   Consultants:   None  Procedures:   None  Antimicrobials:  Unasyn    Subjective: Has to have a bowel movement. Otherwise no concerns. Unsure of what he took other than cocaine.  Objective: Vitals:   01/22/20 0300 01/22/20 0400 01/22/20 0500 01/22/20 0600  BP:  (!) 150/95  139/82  Pulse: (!) 104 (!) 107  (!) 105  Resp: (!) 24  (!) 21  (!) 25  Temp:   97.7 F (36.5 C)   TempSrc:   Oral   SpO2: 94% 93%  94%  Weight:      Height:        Intake/Output Summary (Last 24 hours) at 01/22/2020 0811 Last data filed at 01/22/2020 0700 Gross per 24 hour  Intake 1397.83 ml  Output 300 ml  Net 1097.83 ml   Filed Weights   01/21/20 0827  Weight: 77.2 kg    Examination:  General exam: Appears calm and comfortable Respiratory system: Mild rales on expiration. Respiratory effort normal. Cardiovascular system: S1 & S2 heard, RRR. No murmurs, rubs, gallops or clicks. Gastrointestinal system: Abdomen is nondistended, soft and nontender. No organomegaly or masses felt. Normal bowel sounds heard. Central nervous system: Alert and oriented. No focal neurological deficits. Musculoskeletal: No edema. No calf tenderness Skin: No cyanosis. No rashes Psychiatry: Judgement and insight appear normal. Mood & affect appropriate.     Data Reviewed: I have personally reviewed following labs and imaging studies  CBC Lab Results  Component Value Date   WBC 6.6 01/21/2020   WBC 6.6 01/21/2020   RBC 4.35 01/21/2020   RBC 4.29 01/21/2020   HGB 13.1 01/21/2020   HGB 13.0 01/21/2020   HCT 40.8 01/21/2020   HCT 40.3 01/21/2020   MCV 93.8 01/21/2020   MCV 93.9 01/21/2020   MCH 30.1 01/21/2020   MCH 30.3 01/21/2020   PLT 315 01/21/2020   PLT 329 01/21/2020   MCHC 32.1 01/21/2020   MCHC 32.3 01/21/2020   RDW 13.7 01/21/2020   RDW 13.8 01/21/2020   LYMPHSABS 1.2 01/20/2020  MONOABS 0.6 01/20/2020   EOSABS 0.1 01/20/2020   BASOSABS 0.0 01/20/2020     Last metabolic panel Lab Results  Component Value Date   NA 137 01/22/2020   K 4.1 01/22/2020   CL 101 01/22/2020   CO2 23 01/22/2020   BUN 16 01/22/2020   CREATININE 1.07 01/22/2020   GLUCOSE 81 01/22/2020   GFRNONAA >60 01/22/2020   GFRAA >60 10/12/2017   CALCIUM 8.4 (L) 01/22/2020   PROT 7.0 01/20/2020   ALBUMIN 3.7 01/20/2020   BILITOT 0.4 01/20/2020    ALKPHOS 77 01/20/2020   AST 28 01/20/2020   ALT 20 01/20/2020   ANIONGAP 13 01/22/2020    CBG (last 3)  No results for input(s): GLUCAP in the last 72 hours.   GFR: Estimated Creatinine Clearance: 81.7 mL/min (by C-G formula based on SCr of 1.07 mg/dL).  Coagulation Profile: No results for input(s): INR, PROTIME in the last 168 hours.  Recent Results (from the past 240 hour(s))  Resp Panel by RT-PCR (Flu A&B, Covid) Nasopharyngeal Swab     Status: None   Collection Time: 01/20/20  4:43 PM   Specimen: Nasopharyngeal Swab; Nasopharyngeal(NP) swabs in vial transport medium  Result Value Ref Range Status   SARS Coronavirus 2 by RT PCR NEGATIVE NEGATIVE Final    Comment: (NOTE) SARS-CoV-2 target nucleic acids are NOT DETECTED.  The SARS-CoV-2 RNA is generally detectable in upper respiratory specimens during the acute phase of infection. The lowest concentration of SARS-CoV-2 viral copies this assay can detect is 138 copies/mL. A negative result does not preclude SARS-Cov-2 infection and should not be used as the sole basis for treatment or other patient management decisions. A negative result may occur with  improper specimen collection/handling, submission of specimen other than nasopharyngeal swab, presence of viral mutation(s) within the areas targeted by this assay, and inadequate number of viral copies(<138 copies/mL). A negative result must be combined with clinical observations, patient history, and epidemiological information. The expected result is Negative.  Fact Sheet for Patients:  BloggerCourse.com  Fact Sheet for Healthcare Providers:  SeriousBroker.it  This test is no t yet approved or cleared by the [place] FDA and  has been authorized for detection and/or diagnosis of SARS-CoV-2 by FDA under an Emergency Use Authorization (EUA). This EUA will remain  in effect (meaning this test can be used) for the  duration of the COVID-19 declaration under Section 564(b)(1) of the Act, 21 U.S.C.section 360bbb-3(b)(1), unless the authorization is terminated  or revoked sooner.       Influenza A by PCR NEGATIVE NEGATIVE Final   Influenza B by PCR NEGATIVE NEGATIVE Final    Comment: (NOTE) The Xpert Xpress SARS-CoV-2/FLU/RSV plus assay is intended as an aid in the diagnosis of influenza from Nasopharyngeal swab specimens and should not be used as a sole basis for treatment. Nasal washings and aspirates are unacceptable for Xpert Xpress SARS-CoV-2/FLU/RSV testing.  Fact Sheet for Patients: BloggerCourse.com  Fact Sheet for Healthcare Providers: SeriousBroker.it  This test is not yet approved or cleared by the [place] FDA and has been authorized for detection and/or diagnosis of SARS-CoV-2 by FDA under an Emergency Use Authorization (EUA). This EUA will remain in effect (meaning this test can be used) for the duration of the COVID-19 declaration under Section 564(b)(1) of the Act, 21 U.S.C. section 360bbb-3(b)(1), unless the authorization is terminated or revoked.  Performed at [facility], [address], [place], [place] [address]   MRSA PCR Screening  Status: None   Collection Time: 01/21/20  8:22 AM   Specimen: Nasopharyngeal  Result Value Ref Range Status   MRSA by PCR NEGATIVE NEGATIVE Final    Comment:        The GeneXpert MRSA Assay (FDA approved for NASAL specimens only), is one component of a comprehensive MRSA colonization surveillance program. It is not intended to diagnose MRSA infection nor to guide or monitor treatment for MRSA infections. Performed at [facility], [address], [place], [place] [address]         Radiology Studies: CT Head Wo Contrast  Result Date: 01/20/2020 CLINICAL DATA:  50 year old male with altered mental status. EXAM: CT HEAD WITHOUT  CONTRAST TECHNIQUE: Contiguous axial images were obtained from the base of the skull through the vertex without intravenous contrast. COMPARISON:  None. FINDINGS: Brain: The ventricles and sulci appropriate size for patient's age. The gray-white matter discrimination is preserved. There is no acute intracranial hemorrhage. No mass effect or midline shift. No extra-axial fluid collection. Vascular: No hyperdense vessel or unexpected calcification. Skull: Normal. Negative for fracture or focal lesion. Sinuses/Orbits: Mild mucoperiosteal thickening of paranasal sinuses. No air-fluid level. The mastoid air cells are clear. Other: None IMPRESSION: Unremarkable noncontrast CT of the brain. Electronically Signed   By: Elgie Collard M.D.   On: 01/20/2020 23:26   DG Chest Port 1 View  Result Date: 01/20/2020 CLINICAL DATA:  50 year old male with hypoxia. EXAM: PORTABLE CHEST 1 VIEW COMPARISON:  Chest radiograph dated 10/16/2008. FINDINGS: Right lung base hazy densities may represent atelectasis or atypical infiltrate. Clinical correlation is recommended. No focal consolidation, pleural effusion, pneumothorax. The cardiac silhouette is within limits. No acute osseous pathology. Cervical spine ACDF. IMPRESSION: Right lung base atelectasis versus atypical infiltrate. Electronically Signed   By: Elgie Collard M.D.   On: 01/20/2020 17:28        Scheduled Meds: . Chlorhexidine Gluconate Cloth  6 each Topical Daily  . enoxaparin (LOVENOX) injection  40 mg Subcutaneous Q24H  . mouth rinse  15 mL Mouth Rinse BID   Continuous Infusions: . ampicillin-sulbactam (UNASYN) IV Stopped (01/22/20 0358)  . lactated ringers 50 mL/hr at 01/22/20 0700     LOS: 1 day     Jacquelin Hawking, MD [facility] 01/22/2020, 8:11 AM  If 7PM-7AM, please contact night-coverage [email or web address]

## 2020-01-23 ENCOUNTER — Other Ambulatory Visit: Payer: Self-pay

## 2020-01-23 DIAGNOSIS — T50901D Poisoning by unspecified drugs, medicaments and biological substances, accidental (unintentional), subsequent encounter: Secondary | ICD-10-CM

## 2020-01-23 LAB — CBC
HCT: 39.2 % (ref 39.0–52.0)
Hemoglobin: 12.8 g/dL — ABNORMAL LOW (ref 13.0–17.0)
MCH: 30.4 pg (ref 26.0–34.0)
MCHC: 32.7 g/dL (ref 30.0–36.0)
MCV: 93.1 fL (ref 80.0–100.0)
Platelets: 275 10*3/uL (ref 150–400)
RBC: 4.21 MIL/uL — ABNORMAL LOW (ref 4.22–5.81)
RDW: 14 % (ref 11.5–15.5)
WBC: 10.7 10*3/uL — ABNORMAL HIGH (ref 4.0–10.5)
nRBC: 0 % (ref 0.0–0.2)

## 2020-01-23 LAB — PROCALCITONIN: Procalcitonin: 46.34 ng/mL

## 2020-01-23 MED ORDER — LIP MEDEX EX OINT
TOPICAL_OINTMENT | CUTANEOUS | Status: AC
  Filled 2020-01-23: qty 7

## 2020-01-23 MED ORDER — SENNOSIDES-DOCUSATE SODIUM 8.6-50 MG PO TABS
2.0000 | ORAL_TABLET | Freq: Two times a day (BID) | ORAL | Status: DC
  Administered 2020-01-23 – 2020-01-25 (×5): 2 via ORAL
  Filled 2020-01-23 (×5): qty 2

## 2020-01-23 MED ORDER — POLYETHYLENE GLYCOL 3350 17 G PO PACK
17.0000 g | PACK | Freq: Every day | ORAL | Status: DC
  Administered 2020-01-23 – 2020-01-24 (×2): 17 g via ORAL
  Filled 2020-01-23 (×3): qty 1

## 2020-01-23 MED ORDER — LIP MEDEX EX OINT
TOPICAL_OINTMENT | CUTANEOUS | Status: DC | PRN
  Administered 2020-01-26: 1 via TOPICAL
  Filled 2020-01-23: qty 7

## 2020-01-23 MED ORDER — BISACODYL 10 MG RE SUPP
10.0000 mg | Freq: Every day | RECTAL | Status: DC | PRN
  Filled 2020-01-23: qty 1

## 2020-01-23 NOTE — Progress Notes (Signed)
PROGRESS NOTE    Jonathon Dickerson  PIR:[id number] DOB: Feb 11, 1970 DOA: 01/20/2020 PCP: Bing Neighbors, FNP    Chief Complaint  Patient presents with  . Drug Overdose    Brief Narrative:  Jonathon Dickerson is a 50 y.o. male with a history of bipolar disorder and cocaine abuse. He presented secondary to respiratory failure from unintentional likely opiate overdose requiring multiple doses of Narcan and Narcan drip. pt seen and examined.  He was weaned off oxygen but remains tachycardic and slightly hypertensive.  Assessment & Plan:   Principal Problem:   Aspiration pneumonia (HCC) Active Problems:   Drug overdose   Leukocytosis   AMS (altered mental status)   Prolonged QT interval    Opiate overdose As needed Narcan at this time patient is alert and oriented and answering all questions appropriately.   Acute respiratory failure with hypoxia probably secondary to opiate overdose and aspiration pneumonia Patient is afebrile no leukocytosis.  Patient was started on Unasyn continue the same and trend procalcitonin.  Patient was weaned off oxygen at this time was remains tachycardic and tachypneic. Transfer to the floor later today.   AKI Probably secondary to dehydration.  Improving with IV fluids.  Continue the same. .  DVT prophylaxis: (Lovenox) Code Status: (Full code) Family Communication: none at bedside.  Disposition:   Status is: Inpatient  Remains inpatient appropriate because:IV treatments appropriate due to intensity of illness or inability to take PO and Inpatient level of care appropriate due to severity of illness   Dispo: The patient is from: Home              Anticipated d/c is to: Home              Anticipated d/c date is: 2 days              Patient currently is not medically stable to d/c.       Consultants:   None.    Procedures: none.   Antimicrobials:  Antibiotics Given (last 72 hours)    Date/Time Action Medication Dose Rate    01/20/20 2141 New Bag/Given  [gravity]   cefTRIAXone (ROCEPHIN) 1 g in sodium chloride 0.9 % 100 mL IVPB 1 g 200 mL/hr   01/20/20 2225 New Bag/Given   azithromycin (ZITHROMAX) 500 mg in sodium chloride 0.9 % 250 mL IVPB 500 mg 250 mL/hr   01/21/20 0309 New Bag/Given   Ampicillin-Sulbactam (UNASYN) 3 g in sodium chloride 0.9 % 100 mL IVPB 3 g 200 mL/hr   01/21/20 1218 New Bag/Given   Ampicillin-Sulbactam (UNASYN) 3 g in sodium chloride 0.9 % 100 mL IVPB 3 g 200 mL/hr   01/21/20 1945 New Bag/Given   Ampicillin-Sulbactam (UNASYN) 3 g in sodium chloride 0.9 % 100 mL IVPB 3 g 200 mL/hr   01/22/20 0328 New Bag/Given   Ampicillin-Sulbactam (UNASYN) 3 g in sodium chloride 0.9 % 100 mL IVPB 3 g 200 mL/hr   01/22/20 1013 New Bag/Given   Ampicillin-Sulbactam (UNASYN) 3 g in sodium chloride 0.9 % 100 mL IVPB 3 g 200 mL/hr   01/22/20 1857 New Bag/Given   Ampicillin-Sulbactam (UNASYN) 3 g in sodium chloride 0.9 % 100 mL IVPB 3 g 200 mL/hr   01/23/20 0202 New Bag/Given   Ampicillin-Sulbactam (UNASYN) 3 g in sodium chloride 0.9 % 100 mL IVPB 3 g 200 mL/hr   01/23/20 1020 New Bag/Given   Ampicillin-Sulbactam (UNASYN) 3 g in sodium chloride 0.9 % 100 mL IVPB 3 g  200 mL/hr          Subjective: Requests stool softeners.   Objective: Vitals:   01/23/20 1200 01/23/20 1423 01/23/20 1435 01/23/20 1701  BP: (!) 147/90  (!) 155/95 (!) 129/93  Pulse: (!) 119 (!) 120 (!) 117 (!) 115  Resp: (!) 36 (!) 38 (!) 30 (!) 25  Temp:    98.6 F (37 C)  TempSrc:    Oral  SpO2: 100% 100% 100% (!) 85%  Weight:      Height:        Intake/Output Summary (Last 24 hours) at 01/23/2020 1801 Last data filed at 01/23/2020 1500 Gross per 24 hour  Intake 331.8 ml  Output -  Net 331.8 ml   Filed Weights   01/21/20 0827  Weight: 77.2 kg    Examination:  General exam: Appears calm and comfortable  Respiratory system: AIR ENTRY FAIR, tachypnea present. On 2 lit of Nina oxygen.  Cardiovascular system: S1 & S2  heard, tachycardic, no JVD. No pedal edema. Gastrointestinal system: Abdomen is nondistended, soft and nontender. No organomegaly or masses felt. Normal bowel sounds heard. Central nervous system: Alert and oriented. No focal neurological deficits. Extremities: Symmetric 5 x 5 power. Skin: No rashes, lesions or ulcers Psychiatry:  Mood & affect appropriate.     Data Reviewed: I have personally reviewed following labs and imaging studies  CBC: Recent Labs  Lab 01/20/20 1642 01/21/20 0329 01/23/20 0312  WBC 14.5* 6.6  6.6 10.7*  NEUTROABS 12.5*  --   --   HGB 12.0* 13.0  13.1 12.8*  HCT 37.4* 40.3  40.8 39.2  MCV 94.2 93.9  93.8 93.1  PLT 297 329  315 275    Basic Metabolic Panel: Recent Labs  Lab 01/20/20 1642 01/21/20 0329 01/22/20 0300  NA 135 137 137  K 3.4* 4.1 4.1  CL 100 102 101  CO2 25 25 23   GLUCOSE 203* 82 81  BUN 13 16 16   CREATININE 1.11 1.64*  1.68* 1.07  CALCIUM 8.4* 8.8* 8.4*    GFR: Estimated Creatinine Clearance: 81.7 mL/min (by C-G formula based on SCr of 1.07 mg/dL).  Liver Function Tests: Recent Labs  Lab 01/20/20 1642  AST 28  ALT 20  ALKPHOS 77  BILITOT 0.4  PROT 7.0  ALBUMIN 3.7    CBG: No results for input(s): GLUCAP in the last 168 hours.   Recent Results (from the past 240 hour(s))  Resp Panel by RT-PCR (Flu A&B, Covid) Nasopharyngeal Swab     Status: None   Collection Time: 01/20/20  4:43 PM   Specimen: Nasopharyngeal Swab; Nasopharyngeal(NP) swabs in vial transport medium  Result Value Ref Range Status   SARS Coronavirus 2 by RT PCR NEGATIVE NEGATIVE Final    Comment: (NOTE) SARS-CoV-2 target nucleic acids are NOT DETECTED.  The SARS-CoV-2 RNA is generally detectable in upper respiratory specimens during the acute phase of infection. The lowest concentration of SARS-CoV-2 viral copies this assay can detect is 138 copies/mL. A negative result does not preclude SARS-Cov-2 infection and should not be used as the  sole basis for treatment or other patient management decisions. A negative result may occur with  improper specimen collection/handling, submission of specimen other than nasopharyngeal swab, presence of viral mutation(s) within the areas targeted by this assay, and inadequate number of viral copies(<138 copies/mL). A negative result must be combined with clinical observations, patient history, and epidemiological information. The expected result is Negative.  Fact Sheet for Patients:  03/19/20  Fact Sheet for Healthcare Providers:  SeriousBroker.it  This test is no t yet approved or cleared by the [place] FDA and  has been authorized for detection and/or diagnosis of SARS-CoV-2 by FDA under an Emergency Use Authorization (EUA). This EUA will remain  in effect (meaning this test can be used) for the duration of the COVID-19 declaration under Section 564(b)(1) of the Act, 21 U.S.C.section 360bbb-3(b)(1), unless the authorization is terminated  or revoked sooner.       Influenza A by PCR NEGATIVE NEGATIVE Final   Influenza B by PCR NEGATIVE NEGATIVE Final    Comment: (NOTE) The Xpert Xpress SARS-CoV-2/FLU/RSV plus assay is intended as an aid in the diagnosis of influenza from Nasopharyngeal swab specimens and should not be used as a sole basis for treatment. Nasal washings and aspirates are unacceptable for Xpert Xpress SARS-CoV-2/FLU/RSV testing.  Fact Sheet for Patients: BloggerCourse.com  Fact Sheet for Healthcare Providers: SeriousBroker.it  This test is not yet approved or cleared by the [place] FDA and has been authorized for detection and/or diagnosis of SARS-CoV-2 by FDA under an Emergency Use Authorization (EUA). This EUA will remain in effect (meaning this test can be used) for the duration of the COVID-19 declaration under Section 564(b)(1) of the  Act, 21 U.S.C. section 360bbb-3(b)(1), unless the authorization is terminated or revoked.  Performed at [facility], [address], [place], [place] [address]   MRSA PCR Screening     Status: None   Collection Time: 01/21/20  8:22 AM   Specimen: Nasopharyngeal  Result Value Ref Range Status   MRSA by PCR NEGATIVE NEGATIVE Final    Comment:        The GeneXpert MRSA Assay (FDA approved for NASAL specimens only), is one component of a comprehensive MRSA colonization surveillance program. It is not intended to diagnose MRSA infection nor to guide or monitor treatment for MRSA infections. Performed at [facility], [address] [place], [place] [address]          Radiology Studies: No results found.      Scheduled Meds: . Chlorhexidine Gluconate Cloth  6 each Topical Daily  . enoxaparin (LOVENOX) injection  40 mg Subcutaneous Q24H  . mouth rinse  15 mL Mouth Rinse BID  . polyethylene glycol  17 g Oral Daily  . senna-docusate  2 tablet Oral BID   Continuous Infusions: . ampicillin-sulbactam (UNASYN) IV Stopped (01/23/20 1050)     LOS: 2 days        Kathlen Mody, MD [facility]   To contact the attending provider between 7A-7P or the covering provider during after hours 7P-7A, please log into the web site [email or web address] and access using universal [place] password for that web site. If you do not have the password, please call the hospital operator.  01/23/2020, 6:01 PM

## 2020-01-23 NOTE — Progress Notes (Signed)
   01/23/20 1701  Assess: MEWS Score  Temp 98.6 F (37 C)  BP (!) 129/93  Pulse Rate (!) 115  Resp (!) 25  SpO2 (!) 85 %  O2 Device Nasal Cannula  O2 Flow Rate (L/min) 2 L/min  Assess: MEWS Score  MEWS Temp 0  MEWS Systolic 0  MEWS Pulse 2  MEWS RR 1  MEWS LOC 0  MEWS Score 3  MEWS Score Color Yellow  Assess: if the MEWS score is Yellow or Red  Were vital signs taken at a resting state? Yes  Focused Assessment No change from prior assessment  Early Detection of Sepsis Score *See Row Information* Low  MEWS guidelines implemented *See Row Information* Yes  Treat  MEWS Interventions Other (Comment) (continue to monitor)  Pain Scale 0-10  Pain Score 0  Take Vital Signs  Increase Vital Sign Frequency  Yellow: Q 2hr X 2 then Q 4hr X 2, if remains yellow, continue Q 4hrs  Escalate  MEWS: Escalate Yellow: discuss with charge nurse/RN and consider discussing with provider and RRT  Notify: Charge Nurse/RN  Name of Charge Nurse/RN Notified Zyiah Withington, RN  Date Charge Nurse/RN Notified 01/23/20  Time Charge Nurse/RN Notified 1701  Notify: Provider  Provider Name/Title Kathlen Mody  Date Provider Notified 01/23/20  Time Provider Notified 1730  Notification Type Page  Notification Reason Other (Comment) (keep MD informed)  Response No new orders  Date of Provider Response 01/23/20  Time of Provider Response 1730  Document  Progress note created (see row info) Yes   MD made aware of patients vital signs.

## 2020-01-24 ENCOUNTER — Inpatient Hospital Stay (HOSPITAL_COMMUNITY): Payer: Self-pay

## 2020-01-24 LAB — BASIC METABOLIC PANEL
Anion gap: 11 (ref 5–15)
BUN: 8 mg/dL (ref 6–20)
CO2: 24 mmol/L (ref 22–32)
Calcium: 8.6 mg/dL — ABNORMAL LOW (ref 8.9–10.3)
Chloride: 100 mmol/L (ref 98–111)
Creatinine, Ser: 0.88 mg/dL (ref 0.61–1.24)
GFR, Estimated: >60 mL/min (ref >60)
Glucose, Bld: 147 mg/dL — ABNORMAL HIGH (ref 70–99)
Potassium: 3.3 mmol/L — ABNORMAL LOW (ref 3.5–5.1)
Sodium: 135 mmol/L (ref 135–145)

## 2020-01-24 LAB — CBC
HCT: 37.4 % — ABNORMAL LOW (ref 39.0–52.0)
Hemoglobin: 12.5 g/dL — ABNORMAL LOW (ref 13.0–17.0)
MCH: 30.5 pg (ref 26.0–34.0)
MCHC: 33.4 g/dL (ref 30.0–36.0)
MCV: 91.2 fL (ref 80.0–100.0)
Platelets: 295 10*3/uL (ref 150–400)
RBC: 4.1 MIL/uL — ABNORMAL LOW (ref 4.22–5.81)
RDW: 14.3 % (ref 11.5–15.5)
WBC: 12.8 10*3/uL — ABNORMAL HIGH (ref 4.0–10.5)
nRBC: 0 % (ref 0.0–0.2)

## 2020-01-24 MED ORDER — POTASSIUM CHLORIDE CRYS ER 20 MEQ PO TBCR
40.0000 meq | EXTENDED_RELEASE_TABLET | Freq: Once | ORAL | Status: AC
  Administered 2020-01-24: 40 meq via ORAL
  Filled 2020-01-24: qty 2

## 2020-01-24 NOTE — Consult Note (Signed)
[facility] Face-to-Face Psychiatry Consult   Reason for Consult:  Substance abuse, hx of bipolar medication management Referring Physician:  DR. Blake Divine Patient Identification: Jonathon Dickerson MRN:  [id number] Principal Diagnosis: Aspiration pneumonia [facility]) Diagnosis:  Principal Problem:   Aspiration pneumonia (HCC) Active Problems:   Drug overdose   Leukocytosis   AMS (altered mental status)   Prolonged QT interval   Total Time spent with patient: 45 minutes  Subjective:   Jonathon Dickerson is a 50 y.o. male patient admitted with incidental overdose. Patient reports a history of bipolar, depression, and substance abuse (cocaine). Patient reports "I did something stupid that I shouldn't have done. I found something while cleaning up the hotel and I ate it. I never tried to harm myself. " Patient reports receiving care at [facility] for an extended length of time. He reports he was previously taking Seroquel, Zoloft and Trazodone. He reports compliance with the medication, and denies any previous side effects. He reports a history of substance abuse, that started about 1 year ago after the passing of his mother in February 2021.  He denies any previous attempts to hurt himself at this time. He denies any suicidal ideations at this time, and endorses safety while in the hospital.   Patient is alert and oriented, observed to be eating. He was being assisted by his sister who was at the bedside. He endorses worsening substance use secondary to his mother passing. He reports he uses cocaine 1-2 a week. He endorses a history of bipolar that has been uncontrolled after using cocaine and discontinuing his medications. He denies any recent inpatient admissions at this time. He does express interest in going to a treatment program for substance abuse and social work will be exploring options with him.  HPI:  Jonathon Boehringer Watsonis a 49 y.o.male with a history of bipolar disorder and cocaine abuse. He presented  secondary to respiratory failure from unintentional likely opiate overdose requiring multiple doses of Narcan and Narcan drip.His course complicated by aspiration pneumonia requiring up to 7 lit of [place] oxygen this am. Pt seen and examiend, he appears comfortable requested oxygen for comfort. Repeat x ray to see if his pneumonia is improving.  Past Psychiatric History: Bipolar, depression anc cocaine abuse. He was previously taking Zoloft, seroqeul, and trazodone.He denies any recent inpatient admissions. Denies any previous suicide attempts, thoughts or gestures. He denies any current outpatient provider.   Risk to Self:  Denies Risk to Others:  Denies Prior Inpatient Therapy:  None Prior Outpatient Therapy:  None  Past Medical History:  Past Medical History:  Diagnosis Date  . Anxiety   . Bipolar disorder (HCC)   . Depression   . Migraine     Past Surgical History:  Procedure Laterality Date  . NO PAST SURGERIES     Family History:  Family History  Problem Relation Age of Onset  . Diabetes Mother   . Stroke Mother   . Bipolar disorder Sister   . Bipolar disorder Brother   . Bipolar disorder Maternal Grandmother    Family Psychiatric  History: Sister diagnosed manic depression, states it is mildly controlled. Strong maternal history of depression and bipolar.   Social History:  Social History   Substance and Sexual Activity  Alcohol Use Yes  . Alcohol/week: 3.0 - 4.0 standard drinks  . Types: 3 - 4 Standard drinks or equivalent per week     Social History   Substance and Sexual Activity  Drug Use Yes  .  Types: Cocaine, Marijuana   Comment: 02/19/2018- last cocaine use 4-5 days ago    Social History   Socioeconomic History  . Marital status: Single    Spouse name: Not on file  . Number of children: 3  . 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 Highest education level: Some college, no degree  Occupational History  . Not  on file  Tobacco Use  . Smoking status: Current Some Day Smoker    Packs/day: 0.15  . Smokeless tobacco: Former NeurosurgeonUser    Types: Snuff    Quit date: 2000  . Tobacco comment: once a week  Vaping Use  . Vaping Use: Never used  Substance and Sexual Activity  . Alcohol use: Yes    Alcohol/week: 3.0 - 4.0 standard drinks    Types: 3 - 4 Standard drinks or equivalent per week  . Drug use: Yes    Types: Cocaine, Marijuana    Comment: 02/19/2018- last cocaine use 4-5 days ago  . Sexual activity: Not on file  Other Topics Concern  . Not on file  Social History Narrative   Lives at home with his dad   Right handed   Caffeine: "every now and then"   Social Determinants of Health   Financial Resource Strain: Not on file  Food Insecurity: Not on file  Transportation Needs: Not on file  Physical Activity: Not on file  Stress: Not on file  Social Connections: Not on file   Additional Social History:    Allergies:   Allergies  Allergen Reactions  . Hornet Venom Anaphylaxis    Pt reported allergy to all bees.  . Other Shortness Of Breath and Swelling    BEES    Labs:  Results for orders placed or performed during the hospital encounter of 01/20/20 (from the past 48 hour(s))  Procalcitonin     Status: None   Collection Time: 01/23/20  3:12 AM  Result Value Ref Range   Procalcitonin 46.34 ng/mL    Comment:        Interpretation: PCT >= 10 ng/mL: Important systemic inflammatory response, almost exclusively due to severe bacterial sepsis or septic shock. (NOTE)       Sepsis PCT Algorithm           Lower Respiratory Tract                                      Infection PCT Algorithm    ----------------------------     ----------------------------         PCT < 0.25 ng/mL                PCT < 0.10 ng/mL          Strongly encourage             Strongly discourage   discontinuation of antibiotics    initiation of antibiotics    ----------------------------      -----------------------------       PCT 0.25 - 0.50 ng/mL            PCT 0.10 - 0.25 ng/mL               OR       >80% decrease in PCT            Discourage initiation of  antibiotics      Encourage discontinuation           of antibiotics    ----------------------------     -----------------------------         PCT >= 0.50 ng/mL              PCT 0.26 - 0.50 ng/mL                AND       <80% decrease in PCT             Encourage initiation of                                             antibiotics       Encourage continuation           of antibiotics    ----------------------------     -----------------------------        PCT >= 0.50 ng/mL                  PCT > 0.50 ng/mL               AND         increase in PCT                  Strongly encourage                                      initiation of antibiotics    Strongly encourage escalation           of antibiotics                                     -----------------------------                                           PCT <= 0.25 ng/mL                                                 OR                                        > 80% decrease in PCT                                      Discontinue / Do not initiate                                             antibiotics  Performed at [facility], [address] [place], [place] [address]   CBC     Status: Abnormal   Collection Time: 01/23/20  3:12 AM  Result Value Ref Range   WBC 10.7 (H) 4.0 - 10.5 K/uL   RBC 4.21 (L) 4.22 - 5.81 MIL/uL   Hemoglobin 12.8 (L) 13.0 - 17.0 g/dL   HCT 38.1 01.7 - 51.0 %   MCV 93.1 80.0 - 100.0 fL   MCH 30.4 26.0 - 34.0 pg   MCHC 32.7 30.0 - 36.0 g/dL   RDW 25.8 52.7 - 78.2 %   Platelets 275 150 - 400 K/uL   nRBC 0.0 0.0 - 0.2 %    Comment: Performed at [facility], [address] [place], [place] [address]  CBC     Status: Abnormal   Collection Time:  01/24/20 10:51 AM  Result Value Ref Range   WBC 12.8 (H) 4.0 - 10.5 K/uL   RBC 4.10 (L) 4.22 - 5.81 MIL/uL   Hemoglobin 12.5 (L) 13.0 - 17.0 g/dL   HCT 61.4 (L) 43.1 - 54.0 %   MCV 91.2 80.0 - 100.0 fL   MCH 30.5 26.0 - 34.0 pg   MCHC 33.4 30.0 - 36.0 g/dL   RDW 08.6 76.1 - 95.0 %   Platelets 295 150 - 400 K/uL   nRBC 0.0 0.0 - 0.2 %    Comment: Performed at [facility], [address] [place], [place] [address]  Basic metabolic panel     Status: Abnormal   Collection Time: 01/24/20 10:51 AM  Result Value Ref Range   Sodium 135 135 - 145 mmol/L   Potassium 3.3 (L) 3.5 - 5.1 mmol/L   Chloride 100 98 - 111 mmol/L   CO2 24 22 - 32 mmol/L   Glucose, Bld 147 (H) 70 - 99 mg/dL    Comment: Glucose reference range applies only to samples taken after fasting for at least 8 hours.   BUN 8 6 - 20 mg/dL   Creatinine, Ser 1.24 0.61 - 1.24 mg/dL   Calcium 8.6 (L) 8.9 - 10.3 mg/dL   GFR, Estimated >58 >09 mL/min    Comment: (NOTE) Calculated using the CKD-EPI Creatinine Equation (2021)    Anion gap 11 5 - 15    Comment: Performed at [facility], [address] [place], [place] [address]    Current Facility-Administered Medications  Medication Dose Route Frequency Provider Last Rate Last Admin  . acetaminophen (TYLENOL) tablet 650 mg  650 mg Oral Q6H PRN Kathlen Mody, MD   650 mg at 01/22/20 2258   Or  . acetaminophen (TYLENOL) suppository 650 mg  650 mg Rectal Q6H PRN Kathlen Mody, MD      . Ampicillin-Sulbactam (UNASYN) 3 g in sodium chloride 0.9 % 100 mL IVPB  3 g Intravenous Q8H Kathlen Mody, MD 200 mL/hr at 01/24/20 1405 3 g at 01/24/20 1405  . bisacodyl (DULCOLAX) suppository 10 mg  10 mg Rectal Daily PRN Kathlen Mody, MD      . enoxaparin (LOVENOX) injection 40 mg  40 mg Subcutaneous Q24H Kathlen Mody, MD   40 mg at 01/24/20 1005  . ipratropium-albuterol (DUONEB) 0.5-2.5 (3) MG/3ML nebulizer solution 3 mL  3 mL Nebulization Q6H PRN Kathlen Mody, MD      . lip balm (CARMEX) ointment   Topical PRN Kathlen Mody, MD      . MEDLINE mouth rinse  15 mL Mouth Rinse BID Kathlen Mody, MD   15 mL at 01/24/20 1005  . naloxone (NARCAN) injection 0.4 mg  0.4 mg Intravenous PRN Kathlen Mody, MD  0.4 mg at 01/21/20 0057  . polyethylene glycol (MIRALAX / GLYCOLAX) packet 17 g  17 g Oral Daily Kathlen ModyAkula, Vijaya, MD   17 g at 01/24/20 1005  . senna-docusate (Senokot-S) tablet 2 tablet  2 tablet Oral BID Kathlen ModyAkula, Vijaya, MD   2 tablet at 01/24/20 1005    Musculoskeletal: Strength & Muscle Tone: within normal limits Gait & Station: normal Patient leans: N/A  Psychiatric Specialty Exam: Physical Exam  Review of Systems  Blood pressure (!) 143/88, pulse (!) 119, temperature 99 F (37.2 C), temperature source Oral, resp. rate 16, height 5\' 8"  (1.727 m), weight 77.2 kg, SpO2 94 %.Body mass index is 25.88 kg/m.  General Appearance: Fairly Groomed  Eye Contact:  Fair  Speech:  Clear and Coherent and Pressured  Volume:  Normal  Mood:  Anxious  Affect:  Appropriate and Blunt  Thought Process:  Coherent, Linear and Descriptions of Associations: Intact  Orientation:  Full (Time, Place, and Person)  Thought Content:  Logical  Suicidal Thoughts:  No  Homicidal Thoughts:  No  Memory:  Immediate;   Fair Recent;   Fair  Judgement:  Intact  Insight:  Fair  Psychomotor Activity:  Normal  Concentration:  Concentration: Fair and Attention Span: Fair  Recall:  Good  Fund of Knowledge:  Good  Language:  Good  Akathisia:  No  Handed:  Right  AIMS (if indicated):     Assets:  Communication Skills Desire for Improvement Financial Resources/Insurance Physical Health Resilience Social Support  ADL's:  Intact  Cognition:  WNL  Sleep:        Treatment Plan Summary: Plan Recommend referral to [facility] for substance abuse. Patient will liekly benefit from grief counseling. At this time he has prolonged QTC and will not be able to initiate Seroquel at  this time. WIll start zoloft 25mg  po daily for depression. Will start trileptal 150mg  po BID fo rmood stabilization. WIll start Trazodone 100mg  po qhs prn for insomnia.   Disposition: No evidence of imminent risk to self or others at present.   Patient does not meet criteria for psychiatric inpatient admission.  Maryagnes Amosakia S Starkes-Perry, FNP 01/24/2020 7:33 PM

## 2020-01-24 NOTE — Progress Notes (Signed)
PROGRESS NOTE    Jonathon Dickerson  QQI:[id number] DOB: 1970-10-24 DOA: 01/20/2020 PCP: Bing Neighbors, FNP    Chief Complaint  Patient presents with  . Drug Overdose    Brief Narrative:  Jonathon Dickerson is a 50 y.o. male with a history of bipolar disorder and cocaine abuse. He presented secondary to respiratory failure from unintentional likely opiate overdose requiring multiple doses of Narcan and Narcan drip. His course complicated by aspiration pneumonia requiring up to 7 lit of [place] oxygen this am.  Pt seen and examiend, he appears comfortable requested oxygen for comfort.  Repeat x ray to see if his pneumonia is improving.  Assessment & Plan:   Principal Problem:   Aspiration pneumonia (HCC) Active Problems:   Drug overdose   Leukocytosis   AMS (altered mental status)   Prolonged QT interval    Opiate overdose As needed Narcan at this time patient is alert and oriented and answering all questions appropriately.   Acute respiratory failure with hypoxia probably secondary to opiate overdose and aspiration pneumonia Patient is afebrile , minimal leukocytosis,  Patient was started on Unasyn continue the same and trend procalcitonin.  Pt is on 7l it of [place] Oxygen today, though it appears he is requesting to be on oxygen for comfort. Plan to wean him off oxygen today and get CXR for follow up.    AKI Probably secondary to dehydration.  Improving with IV fluids.  Continue the same.  Bipolar disorder:  Anxiety Request psychiatry consult for medication assistance.  .  DVT prophylaxis: (Lovenox) Code Status: (Full code) Family Communication: none at bedside.  Disposition:   Status is: Inpatient  Remains inpatient appropriate because:IV treatments appropriate due to intensity of illness or inability to take PO and Inpatient level of care appropriate due to severity of illness   Dispo: The patient is from: Home              Anticipated d/c is to: Home               Anticipated d/c date is: 2 days              Patient currently is not medically stable to d/c.       Consultants:   None.    Procedures: none.   Antimicrobials:  Antibiotics Given (last 72 hours)    Date/Time Action Medication Dose Rate   01/21/20 1945 New Bag/Given   Ampicillin-Sulbactam (UNASYN) 3 g in sodium chloride 0.9 % 100 mL IVPB 3 g 200 mL/hr   01/22/20 0328 New Bag/Given   Ampicillin-Sulbactam (UNASYN) 3 g in sodium chloride 0.9 % 100 mL IVPB 3 g 200 mL/hr   01/22/20 1013 New Bag/Given   Ampicillin-Sulbactam (UNASYN) 3 g in sodium chloride 0.9 % 100 mL IVPB 3 g 200 mL/hr   01/22/20 1857 New Bag/Given   Ampicillin-Sulbactam (UNASYN) 3 g in sodium chloride 0.9 % 100 mL IVPB 3 g 200 mL/hr   01/23/20 0202 New Bag/Given   Ampicillin-Sulbactam (UNASYN) 3 g in sodium chloride 0.9 % 100 mL IVPB 3 g 200 mL/hr   01/23/20 1020 New Bag/Given   Ampicillin-Sulbactam (UNASYN) 3 g in sodium chloride 0.9 % 100 mL IVPB 3 g 200 mL/hr   01/23/20 2206 New Bag/Given   Ampicillin-Sulbactam (UNASYN) 3 g in sodium chloride 0.9 % 100 mL IVPB 3 g 200 mL/hr   01/24/20 0556 New Bag/Given   Ampicillin-Sulbactam (UNASYN) 3 g in sodium chloride 0.9 % 100 mL IVPB  3 g 200 mL/hr   01/24/20 1405 New Bag/Given   Ampicillin-Sulbactam (UNASYN) 3 g in sodium chloride 0.9 % 100 mL IVPB 3 g 200 mL/hr         Subjective: Requests stool softeners.   Objective: Vitals:   01/23/20 1701 01/23/20 2055 01/24/20 0052 01/24/20 0559  BP: (!) 129/93 126/71 122/74 (!) 127/109  Pulse: (!) 115 (!) 108 (!) 103 (!) 103  Resp: (!) 25 16 16 14   Temp: 98.6 F (37 C) 98.5 F (36.9 C) 98.1 F (36.7 C) 98 F (36.7 C)  TempSrc: Oral Oral Oral Oral  SpO2: (!) 85% 100% 99% 98%  Weight:      Height:        Intake/Output Summary (Last 24 hours) at 01/24/2020 1412 Last data filed at 01/24/2020 1408 Gross per 24 hour  Intake 817.63 ml  Output 1650 ml  Net -832.37 ml   Filed Weights   01/21/20 0827  Weight:  77.2 kg    Examination:  General exam: alert and comfortable on 7 lit of [place] oxygen.  Respiratory system: air entry fair, no wheezing heard on 7 lit of [place] oxygen.  Cardiovascular system: S1S2 , tachycardic, no JVD, NO PEDAL EDEMA.  Gastrointestinal system: Abdomen is soft no tenderness, bowel sounds good.  Central nervous system: alert and oriented.  Extremities: no pedal edema.  Skin: No rashes seen.  Psychiatry:  Mood is appropriate.     Data Reviewed: I have personally reviewed following labs and imaging studies  CBC: Recent Labs  Lab 01/20/20 1642 01/21/20 0329 01/23/20 0312 01/24/20 1051  WBC 14.5* 6.6  6.6 10.7* 12.8*  NEUTROABS 12.5*  --   --   --   HGB 12.0* 13.0  13.1 12.8* 12.5*  HCT 37.4* 40.3  40.8 39.2 37.4*  MCV 94.2 93.9  93.8 93.1 91.2  PLT 297 329  315 275 295    Basic Metabolic Panel: Recent Labs  Lab 01/20/20 1642 01/21/20 0329 01/22/20 0300 01/24/20 1051  NA 135 137 137 135  K 3.4* 4.1 4.1 3.3*  CL 100 102 101 100  CO2 25 25 23 24   GLUCOSE 203* 82 81 147*  BUN 13 16 16 8   CREATININE 1.11 1.64*  1.68* 1.07 0.88  CALCIUM 8.4* 8.8* 8.4* 8.6*    GFR: Estimated Creatinine Clearance: 99.3 mL/min (by C-G formula based on SCr of 0.88 mg/dL).  Liver Function Tests: Recent Labs  Lab 01/20/20 1642  AST 28  ALT 20  ALKPHOS 77  BILITOT 0.4  PROT 7.0  ALBUMIN 3.7    CBG: No results for input(s): GLUCAP in the last 168 hours.   Recent Results (from the past 240 hour(s))  Resp Panel by RT-PCR (Flu A&B, Covid) Nasopharyngeal Swab     Status: None   Collection Time: 01/20/20  4:43 PM   Specimen: Nasopharyngeal Swab; Nasopharyngeal(NP) swabs in vial transport medium  Result Value Ref Range Status   SARS Coronavirus 2 by RT PCR NEGATIVE NEGATIVE Final    Comment: (NOTE) SARS-CoV-2 target nucleic acids are NOT DETECTED.  The SARS-CoV-2 RNA is generally detectable in upper respiratory specimens during the acute phase of infection. The  lowest concentration of SARS-CoV-2 viral copies this assay can detect is 138 copies/mL. A negative result does not preclude SARS-Cov-2 infection and should not be used as the sole basis for treatment or other patient management decisions. A negative result may occur with  improper specimen collection/handling, submission of specimen other than nasopharyngeal swab, presence  of viral mutation(s) within the areas targeted by this assay, and inadequate number of viral copies(<138 copies/mL). A negative result must be combined with clinical observations, patient history, and epidemiological information. The expected result is Negative.  Fact Sheet for Patients:  BloggerCourse.com  Fact Sheet for Healthcare Providers:  SeriousBroker.it  This test is no t yet approved or cleared by the [place] FDA and  has been authorized for detection and/or diagnosis of SARS-CoV-2 by FDA under an Emergency Use Authorization (EUA). This EUA will remain  in effect (meaning this test can be used) for the duration of the COVID-19 declaration under Section 564(b)(1) of the Act, 21 U.S.C.section 360bbb-3(b)(1), unless the authorization is terminated  or revoked sooner.       Influenza A by PCR NEGATIVE NEGATIVE Final   Influenza B by PCR NEGATIVE NEGATIVE Final    Comment: (NOTE) The Xpert Xpress SARS-CoV-2/FLU/RSV plus assay is intended as an aid in the diagnosis of influenza from Nasopharyngeal swab specimens and should not be used as a sole basis for treatment. Nasal washings and aspirates are unacceptable for Xpert Xpress SARS-CoV-2/FLU/RSV testing.  Fact Sheet for Patients: BloggerCourse.com  Fact Sheet for Healthcare Providers: SeriousBroker.it  This test is not yet approved or cleared by the [place] FDA and has been authorized for detection and/or diagnosis of SARS-CoV-2 by FDA under  an Emergency Use Authorization (EUA). This EUA will remain in effect (meaning this test can be used) for the duration of the COVID-19 declaration under Section 564(b)(1) of the Act, 21 U.S.C. section 360bbb-3(b)(1), unless the authorization is terminated or revoked.  Performed at [facility], [address] [place], [place] [address]   MRSA PCR Screening     Status: None   Collection Time: 01/21/20  8:22 AM   Specimen: Nasopharyngeal  Result Value Ref Range Status   MRSA by PCR NEGATIVE NEGATIVE Final    Comment:        The GeneXpert MRSA Assay (FDA approved for NASAL specimens only), is one component of a comprehensive MRSA colonization surveillance program. It is not intended to diagnose MRSA infection nor to guide or monitor treatment for MRSA infections. Performed at [facility], [address], [place], [place] [address]          Radiology Studies: No results found.      Scheduled Meds: . enoxaparin (LOVENOX) injection  40 mg Subcutaneous Q24H  . mouth rinse  15 mL Mouth Rinse BID  . polyethylene glycol  17 g Oral Daily  . potassium chloride  40 mEq Oral Once  . senna-docusate  2 tablet Oral BID   Continuous Infusions: . ampicillin-sulbactam (UNASYN) IV 3 g (01/24/20 1405)     LOS: 3 days        Kathlen Mody, MD [facility]   To contact the attending provider between 7A-7P or the covering provider during after hours 7P-7A, please log into the web site [email or web address] and access using universal [place] password for that web site. If you do not have the password, please call the hospital operator.  01/24/2020, 2:12 PM

## 2020-01-25 MED ORDER — GABAPENTIN 300 MG PO CAPS
300.0000 mg | ORAL_CAPSULE | Freq: Three times a day (TID) | ORAL | Status: DC
Start: 2020-01-25 — End: 2020-01-26
  Administered 2020-01-26: 300 mg via ORAL
  Filled 2020-01-25: qty 1

## 2020-01-25 MED ORDER — SERTRALINE HCL 25 MG PO TABS
25.0000 mg | ORAL_TABLET | Freq: Every day | ORAL | Status: DC
  Administered 2020-01-25 – 2020-01-28 (×4): 25 mg via ORAL
  Filled 2020-01-25 (×4): qty 1

## 2020-01-25 MED ORDER — OXCARBAZEPINE 150 MG PO TABS
150.0000 mg | ORAL_TABLET | Freq: Two times a day (BID) | ORAL | Status: DC
  Administered 2020-01-25 – 2020-01-28 (×7): 150 mg via ORAL
  Filled 2020-01-25 (×7): qty 1

## 2020-01-25 MED ORDER — SENNOSIDES-DOCUSATE SODIUM 8.6-50 MG PO TABS: 1.0000 | ORAL_TABLET | Freq: Every evening | ORAL | Status: DC | PRN

## 2020-01-25 NOTE — Progress Notes (Signed)
   01/25/20 1531  Assess: MEWS Score  Temp 100 F (37.8 C)  BP 138/89  Pulse Rate (!) 131  Resp 20  Level of Consciousness Alert  SpO2 94 %  Assess: MEWS Score  MEWS Temp 0  MEWS Systolic 0  MEWS Pulse 3  MEWS RR 0  MEWS LOC 0  MEWS Score 3  MEWS Score Color Yellow  Assess: if the MEWS score is Yellow or Red  Were vital signs taken at a resting state? Yes  Focused Assessment Change from prior assessment (see assessment flowsheet)  Early Detection of Sepsis Score *See Row Information* Low  MEWS guidelines implemented *See Row Information* Yes  Treat  Pain Scale 0-10  Pain Score 0  Take Vital Signs  Increase Vital Sign Frequency  Yellow: Q 2hr X 2 then Q 4hr X 2, if remains yellow, continue Q 4hrs  Notify: Charge Nurse/RN  Name of Charge Nurse/RN Notified Maxwell [place]  Date Charge Nurse/RN Notified 01/25/20  Time Charge Nurse/RN Notified 1532  Notify: Provider  Provider Name/Title Kathlen Mody  Date Provider Notified 01/25/20  Time Provider Notified 1533  Notification Type Call  Notification Reason Change in status  Response See new orders  Date of Provider Response 01/25/20  Time of Provider Response 1534  Document  Progress note created (see row info) Yes (O2 applied)

## 2020-01-25 NOTE — Plan of Care (Signed)

## 2020-01-25 NOTE — Progress Notes (Signed)
PROGRESS NOTE    Jonathon Dickerson  PYP:[id number] DOB: 06/18/1970 DOA: 01/20/2020 PCP: Bing Neighbors, FNP    Chief Complaint  Patient presents with  . Drug Overdose    Brief Narrative:  Jonathon Dickerson is a 50 y.o. male with a history of bipolar disorder and cocaine abuse. He presented secondary to respiratory failure from unintentional likely opiate overdose requiring multiple doses of Narcan and Narcan drip. His course complicated by aspiration pneumonia . He initially required upto 15 lit of [place] oxygen, weaned him off oxygen today.  Repeat CXR shows multifocal pneumonia.   Assessment & Plan:   Principal Problem:   Aspiration pneumonia (HCC) Active Problems:   Drug overdose   Leukocytosis   AMS (altered mental status)   Prolonged QT interval    Opiate overdose As needed Narcan at this time patient is alert and oriented and answering all questions appropriately.   Acute respiratory failure with hypoxia probably secondary to opiate overdose and aspiration pneumonia Patient is afebrile , minimal leukocytosis,  Patient was started on Unasyn continue the same and trend procalcitonin.  Pt is on 7l it of [place] Oxygen yesterday, weaned him off oxygen.  Repeat CXR  Showed multifocal pneumonia.  Recommend to continue with IV antibiotics for another 24 to 48 hours.    AKI Probably secondary to dehydration.  Improving with IV fluids.  Continue the same.  Bipolar disorder:  Anxiety Requested,  psychiatry consult for medication assistance.  12 lead EKG to check QTC.  Started trileptal and zoloft.  .  DVT prophylaxis: (Lovenox) Code Status: (Full code) Family Communication: none at bedside.  Disposition:   Status is: Inpatient  Remains inpatient appropriate because:IV treatments appropriate due to intensity of illness or inability to take PO and Inpatient level of care appropriate due to severity of illness   Dispo: The patient is from: Home              Anticipated  d/c is to: Home              Anticipated d/c date is: 2 days              Patient currently is not medically stable to d/c.       Consultants:   None.    Procedures: none.   Antimicrobials:  Antibiotics Given (last 72 hours)    Date/Time Action Medication Dose Rate   01/22/20 1857 New Bag/Given   Ampicillin-Sulbactam (UNASYN) 3 g in sodium chloride 0.9 % 100 mL IVPB 3 g 200 mL/hr   01/23/20 0202 New Bag/Given   Ampicillin-Sulbactam (UNASYN) 3 g in sodium chloride 0.9 % 100 mL IVPB 3 g 200 mL/hr   01/23/20 1020 New Bag/Given   Ampicillin-Sulbactam (UNASYN) 3 g in sodium chloride 0.9 % 100 mL IVPB 3 g 200 mL/hr   01/23/20 2206 New Bag/Given   Ampicillin-Sulbactam (UNASYN) 3 g in sodium chloride 0.9 % 100 mL IVPB 3 g 200 mL/hr   01/24/20 0556 New Bag/Given   Ampicillin-Sulbactam (UNASYN) 3 g in sodium chloride 0.9 % 100 mL IVPB 3 g 200 mL/hr   01/24/20 1405 New Bag/Given   Ampicillin-Sulbactam (UNASYN) 3 g in sodium chloride 0.9 % 100 mL IVPB 3 g 200 mL/hr   01/24/20 2109 New Bag/Given   Ampicillin-Sulbactam (UNASYN) 3 g in sodium chloride 0.9 % 100 mL IVPB 3 g 200 mL/hr   01/25/20 0546 New Bag/Given   Ampicillin-Sulbactam (UNASYN) 3 g in sodium chloride 0.9 % 100 mL  IVPB 3 g 200 mL/hr   01/25/20 1329 New Bag/Given   Ampicillin-Sulbactam (UNASYN) 3 g in sodium chloride 0.9 % 100 mL IVPB 3 g 200 mL/hr         Subjective: No chest pain , breathing has improved but still sob.   Objective: Vitals:   01/24/20 0559 01/24/20 1415 01/24/20 2058 01/25/20 0545  BP: (!) 127/109 (!) 143/88 125/79 127/81  Pulse: (!) 103 (!) 119 (!) 109 (!) 107  Resp: 14 16 14 14   Temp: 98 F (36.7 C) 99 F (37.2 C) 99.1 F (37.3 C) 98.9 F (37.2 C)  TempSrc: Oral Oral Oral Oral  SpO2: 98% 94% 93% 96%  Weight:      Height:        Intake/Output Summary (Last 24 hours) at 01/25/2020 1346 Last data filed at 01/25/2020 0909 Gross per 24 hour  Intake 720 ml  Output 2200 ml  Net -1480 ml    Filed Weights   01/21/20 0827  Weight: 77.2 kg    Examination:  General exam: alert and comfortable.   Respiratory system: air entry fair , scattered rhonchi,  Cardiovascular system: S1S2 heard, tachycardic. No JVD, no pedal edema.  Gastrointestinal system: Abdomen is soft, non tender non distended, bowel sounds heard.  Central nervous system: Alert and oriented,  Extremities: No pedal edema.  Skin: No rashes seen.  Psychiatry:  Mood Is appropriate.     Data Reviewed: I have personally reviewed following labs and imaging studies  CBC: Recent Labs  Lab 01/20/20 1642 01/21/20 0329 01/23/20 0312 01/24/20 1051  WBC 14.5* 6.6  6.6 10.7* 12.8*  NEUTROABS 12.5*  --   --   --   HGB 12.0* 13.0  13.1 12.8* 12.5*  HCT 37.4* 40.3  40.8 39.2 37.4*  MCV 94.2 93.9  93.8 93.1 91.2  PLT 297 329  315 275 295    Basic Metabolic Panel: Recent Labs  Lab 01/20/20 1642 01/21/20 0329 01/22/20 0300 01/24/20 1051  NA 135 137 137 135  K 3.4* 4.1 4.1 3.3*  CL 100 102 101 100  CO2 25 25 23 24   GLUCOSE 203* 82 81 147*  BUN 13 16 16 8   CREATININE 1.11 1.64*  1.68* 1.07 0.88  CALCIUM 8.4* 8.8* 8.4* 8.6*    GFR: Estimated Creatinine Clearance: 99.3 mL/min (by C-G formula based on SCr of 0.88 mg/dL).  Liver Function Tests: Recent Labs  Lab 01/20/20 1642  AST 28  ALT 20  ALKPHOS 77  BILITOT 0.4  PROT 7.0  ALBUMIN 3.7    CBG: No results for input(s): GLUCAP in the last 168 hours.   Recent Results (from the past 240 hour(s))  Resp Panel by RT-PCR (Flu A&B, Covid) Nasopharyngeal Swab     Status: None   Collection Time: 01/20/20  4:43 PM   Specimen: Nasopharyngeal Swab; Nasopharyngeal(NP) swabs in vial transport medium  Result Value Ref Range Status   SARS Coronavirus 2 by RT PCR NEGATIVE NEGATIVE Final    Comment: (NOTE) SARS-CoV-2 target nucleic acids are NOT DETECTED.  The SARS-CoV-2 RNA is generally detectable in upper respiratory specimens during the acute  phase of infection. The lowest concentration of SARS-CoV-2 viral copies this assay can detect is 138 copies/mL. A negative result does not preclude SARS-Cov-2 infection and should not be used as the sole basis for treatment or other patient management decisions. A negative result may occur with  improper specimen collection/handling, submission of specimen other than nasopharyngeal swab, presence of viral mutation(s)  within the areas targeted by this assay, and inadequate number of viral copies(<138 copies/mL). A negative result must be combined with clinical observations, patient history, and epidemiological information. The expected result is Negative.  Fact Sheet for Patients:  BloggerCourse.com  Fact Sheet for Healthcare Providers:  SeriousBroker.it  This test is no t yet approved or cleared by the [place] FDA and  has been authorized for detection and/or diagnosis of SARS-CoV-2 by FDA under an Emergency Use Authorization (EUA). This EUA will remain  in effect (meaning this test can be used) for the duration of the COVID-19 declaration under Section 564(b)(1) of the Act, 21 U.S.C.section 360bbb-3(b)(1), unless the authorization is terminated  or revoked sooner.       Influenza A by PCR NEGATIVE NEGATIVE Final   Influenza B by PCR NEGATIVE NEGATIVE Final    Comment: (NOTE) The Xpert Xpress SARS-CoV-2/FLU/RSV plus assay is intended as an aid in the diagnosis of influenza from Nasopharyngeal swab specimens and should not be used as a sole basis for treatment. Nasal washings and aspirates are unacceptable for Xpert Xpress SARS-CoV-2/FLU/RSV testing.  Fact Sheet for Patients: BloggerCourse.com  Fact Sheet for Healthcare Providers: SeriousBroker.it  This test is not yet approved or cleared by the [place] FDA and has been authorized for detection and/or diagnosis of  SARS-CoV-2 by FDA under an Emergency Use Authorization (EUA). This EUA will remain in effect (meaning this test can be used) for the duration of the COVID-19 declaration under Section 564(b)(1) of the Act, 21 U.S.C. section 360bbb-3(b)(1), unless the authorization is terminated or revoked.  Performed at [facility], [address], [place], [place] [address]   MRSA PCR Screening     Status: None   Collection Time: 01/21/20  8:22 AM   Specimen: Nasopharyngeal  Result Value Ref Range Status   MRSA by PCR NEGATIVE NEGATIVE Final    Comment:        The GeneXpert MRSA Assay (FDA approved for NASAL specimens only), is one component of a comprehensive MRSA colonization surveillance program. It is not intended to diagnose MRSA infection nor to guide or monitor treatment for MRSA infections. Performed at [facility], [address], [place], [place] [address]          Radiology Studies: DG CHEST PORT 1 VIEW  Result Date: 01/24/2020 CLINICAL DATA:  Opiate overdose EXAM: PORTABLE CHEST 1 VIEW COMPARISON:  01/20/2020 FINDINGS: Patchy bilateral interstitial and alveolar airspace opacities. No pleural effusion or pneumothorax. Stable cardiomediastinal silhouette. No aggressive osseous lesion. IMPRESSION: Patchy bilateral interstitial and alveolar airspace opacities concerning for multilobar pneumonia including aspiration. Electronically Signed   By: Elige Ko   On: 01/24/2020 15:20        Scheduled Meds: . enoxaparin (LOVENOX) injection  40 mg Subcutaneous Q24H  . mouth rinse  15 mL Mouth Rinse BID   Continuous Infusions: . ampicillin-sulbactam (UNASYN) IV 3 g (01/25/20 1329)     LOS: 4 days        Kathlen Mody, MD [facility]   To contact the attending provider between 7A-7P or the covering provider during after hours 7P-7A, please log into the web site [email or web address] and access using universal [place] password for  that web site. If you do not have the password, please call the hospital operator.  01/25/2020, 1:46 PM

## 2020-01-26 LAB — CBC WITH DIFFERENTIAL/PLATELET
Abs Immature Granulocytes: 0.21 10*3/uL — ABNORMAL HIGH (ref 0.00–0.07)
Basophils Absolute: 0.1 10*3/uL (ref 0.0–0.1)
Basophils Relative: 0 %
Eosinophils Absolute: 0.2 10*3/uL (ref 0.0–0.5)
Eosinophils Relative: 2 %
HCT: 34.8 % — ABNORMAL LOW (ref 39.0–52.0)
Hemoglobin: 11.7 g/dL — ABNORMAL LOW (ref 13.0–17.0)
Immature Granulocytes: 2 %
Lymphocytes Relative: 11 %
Lymphs Abs: 1.3 10*3/uL (ref 0.7–4.0)
MCH: 30 pg (ref 26.0–34.0)
MCHC: 33.6 g/dL (ref 30.0–36.0)
MCV: 89.2 fL (ref 80.0–100.0)
Monocytes Absolute: 1.4 10*3/uL — ABNORMAL HIGH (ref 0.1–1.0)
Monocytes Relative: 12 %
Neutro Abs: 8.6 10*3/uL — ABNORMAL HIGH (ref 1.7–7.7)
Neutrophils Relative %: 73 %
Platelets: 305 10*3/uL (ref 150–400)
RBC: 3.9 MIL/uL — ABNORMAL LOW (ref 4.22–5.81)
RDW: 14.7 % (ref 11.5–15.5)
WBC: 11.7 10*3/uL — ABNORMAL HIGH (ref 4.0–10.5)
nRBC: 0 % (ref 0.0–0.2)

## 2020-01-26 LAB — BASIC METABOLIC PANEL
Anion gap: 11 (ref 5–15)
BUN: 8 mg/dL (ref 6–20)
CO2: 24 mmol/L (ref 22–32)
Calcium: 8.1 mg/dL — ABNORMAL LOW (ref 8.9–10.3)
Chloride: 100 mmol/L (ref 98–111)
Creatinine, Ser: 0.77 mg/dL (ref 0.61–1.24)
GFR, Estimated: >60 mL/min (ref >60)
Glucose, Bld: 120 mg/dL — ABNORMAL HIGH (ref 70–99)
Potassium: 3.1 mmol/L — ABNORMAL LOW (ref 3.5–5.1)
Sodium: 135 mmol/L (ref 135–145)

## 2020-01-26 MED ORDER — MELOXICAM 15 MG PO TABS
15.0000 mg | ORAL_TABLET | Freq: Every day | ORAL | Status: DC
  Administered 2020-01-26 – 2020-01-28 (×3): 15 mg via ORAL
  Filled 2020-01-26 (×3): qty 1

## 2020-01-26 MED ORDER — POTASSIUM CHLORIDE CRYS ER 20 MEQ PO TBCR
40.0000 meq | EXTENDED_RELEASE_TABLET | Freq: Two times a day (BID) | ORAL | Status: AC
  Administered 2020-01-26 (×2): 40 meq via ORAL
  Filled 2020-01-26 (×2): qty 2

## 2020-01-26 MED ORDER — TRAZODONE HCL 50 MG PO TABS
50.0000 mg | ORAL_TABLET | Freq: Every day | ORAL | Status: DC
  Administered 2020-01-26 – 2020-01-27 (×2): 50 mg via ORAL
  Filled 2020-01-26 (×2): qty 1

## 2020-01-26 NOTE — Progress Notes (Signed)
PROGRESS NOTE    Jonathon Dickerson  YIF:[id number] DOB: 25-May-1970 DOA: 01/20/2020 PCP: Bing Neighbors, FNP    Chief Complaint  Patient presents with  . Drug Overdose    Brief Narrative:  Jonathon Dickerson is a 50 y.o. male with a history of bipolar disorder and cocaine abuse. He presented secondary to respiratory failure from unintentional likely opiate overdose requiring multiple doses of Narcan and Narcan drip. His course complicated by aspiration pneumonia . He initially required upto 15 lit of [place] oxygen, weaned him off oxygen today.  Repeat CXR shows multifocal pneumonia. Pt seen and examined at bedside, requesting meloxican for pain. Currently on 3 to 4 lit of [place] oxygen.   Assessment & Plan:   Principal Problem:   Aspiration pneumonia (HCC) Active Problems:   Drug overdose   Leukocytosis   AMS (altered mental status)   Prolonged QT interval    Opiate overdose As needed Narcan at this time patient is alert and oriented and answering all questions appropriately. Psychiatry and TOC on board.    Acute respiratory failure with hypoxia probably secondary to opiate overdose and aspiration pneumonia Patient is afebrile , minimal leukocytosis,  Patient was started on Unasyn continue the same and trend procalcitonin.  Pt initially was on [place] oxygen at one point we were able to wean him off oxygen, but currently is requiring up to 3 lit of [place] oxygen.  Repeat CXR  Showed multifocal pneumonia.  Would continue with UNASYN. Get PT evaluation and check ambulating oxygen levels.    AKI Probably secondary to dehydration.  Improving with IV fluids.  Continue the same.  Bipolar disorder:  Anxiety Requested,  psychiatry consult for medication assistance.  12 lead EKG to check QTC is 449. Started trileptal and zoloft. Added trazodone for insomnia   Hypokalemia:  Replaced.    Leukocytosis probably from aspiration pneumonia, improving with antibiotics.  Check pro calcitonin  tomorrow.  .  DVT prophylaxis: (Lovenox) Code Status: (Full code) Family Communication: none at bedside.  Disposition:   Status is: Inpatient  Remains inpatient appropriate because:IV treatments appropriate due to intensity of illness or inability to take PO and Inpatient level of care appropriate due to severity of illness   Dispo: The patient is from: Home              Anticipated d/c is to: Home              Anticipated d/c date is: 2 days              Patient currently is not medically stable to d/c.       Consultants:   None.    Procedures: none.   Antimicrobials:  Antibiotics Given (last 72 hours)    Date/Time Action Medication Dose Rate   01/23/20 1020 New Bag/Given   Ampicillin-Sulbactam (UNASYN) 3 g in sodium chloride 0.9 % 100 mL IVPB 3 g 200 mL/hr   01/23/20 2206 New Bag/Given   Ampicillin-Sulbactam (UNASYN) 3 g in sodium chloride 0.9 % 100 mL IVPB 3 g 200 mL/hr   01/24/20 0556 New Bag/Given   Ampicillin-Sulbactam (UNASYN) 3 g in sodium chloride 0.9 % 100 mL IVPB 3 g 200 mL/hr   01/24/20 1405 New Bag/Given   Ampicillin-Sulbactam (UNASYN) 3 g in sodium chloride 0.9 % 100 mL IVPB 3 g 200 mL/hr   01/24/20 2109 New Bag/Given   Ampicillin-Sulbactam (UNASYN) 3 g in sodium chloride 0.9 % 100 mL IVPB 3 g 200 mL/hr  01/25/20 0546 New Bag/Given   Ampicillin-Sulbactam (UNASYN) 3 g in sodium chloride 0.9 % 100 mL IVPB 3 g 200 mL/hr   01/25/20 1329 New Bag/Given   Ampicillin-Sulbactam (UNASYN) 3 g in sodium chloride 0.9 % 100 mL IVPB 3 g 200 mL/hr   01/25/20 2312 New Bag/Given   Ampicillin-Sulbactam (UNASYN) 3 g in sodium chloride 0.9 % 100 mL IVPB 3 g 200 mL/hr   01/26/20 0510 New Bag/Given   Ampicillin-Sulbactam (UNASYN) 3 g in sodium chloride 0.9 % 100 mL IVPB 3 g 200 mL/hr         Subjective: On oxygen appears comfortable.   Objective: Vitals:   01/25/20 1531 01/25/20 1833 01/25/20 2200 01/26/20 0530  BP: 138/89 128/78 118/88 127/80  Pulse: (!) 131  (!) 110 (!) 103 97  Resp: 20 (!) 34 (!) 28 18  Temp: 100 F (37.8 C) 99.3 F (37.4 C) 99.5 F (37.5 C) 98 F (36.7 C)  TempSrc: Oral Oral Oral Oral  SpO2: 94% 92% 91% 98%  Weight:      Height:        Intake/Output Summary (Last 24 hours) at 01/26/2020 0847 Last data filed at 01/26/2020 0530 Gross per 24 hour  Intake 934.07 ml  Output 2250 ml  Net -1315.93 ml   Filed Weights   01/21/20 0827  Weight: 77.2 kg    Examination:  General exam: alert and oriented, comfortable on oxygen.  Respiratory system: Bilateral air entry fair, scattered rhonchi, on 3 lit of [place] Oxygen, tachypnea present.  Cardiovascular system: S1S2 heard, tachycardic, no JVD, no pedal edema.  Gastrointestinal system: Abdomen is soft, NT ND BS+ Central nervous system: alert and oriented, non focal.  Extremities: No edema.  Skin: No rashes seen.  Psychiatry:  Mood is appropriate.     Data Reviewed: I have personally reviewed following labs and imaging studies  CBC: Recent Labs  Lab 01/20/20 1642 01/21/20 0329 01/23/20 0312 01/24/20 1051 01/26/20 0552  WBC 14.5* 6.6  6.6 10.7* 12.8* 11.7*  NEUTROABS 12.5*  --   --   --  8.6*  HGB 12.0* 13.0  13.1 12.8* 12.5* 11.7*  HCT 37.4* 40.3  40.8 39.2 37.4* 34.8*  MCV 94.2 93.9  93.8 93.1 91.2 89.2  PLT 297 329  315 275 295 305    Basic Metabolic Panel: Recent Labs  Lab 01/20/20 1642 01/21/20 0329 01/22/20 0300 01/24/20 1051 01/26/20 0552  NA 135 137 137 135 135  K 3.4* 4.1 4.1 3.3* 3.1*  CL 100 102 101 100 100  CO2 25 25 23 24 24   GLUCOSE 203* 82 81 147* 120*  BUN 13 16 16 8 8   CREATININE 1.11 1.64*  1.68* 1.07 0.88 0.77  CALCIUM 8.4* 8.8* 8.4* 8.6* 8.1*    GFR: Estimated Creatinine Clearance: 109.3 mL/min (by C-G formula based on SCr of 0.77 mg/dL).  Liver Function Tests: Recent Labs  Lab 01/20/20 1642  AST 28  ALT 20  ALKPHOS 77  BILITOT 0.4  PROT 7.0  ALBUMIN 3.7    CBG: No results for input(s): GLUCAP in the last 168  hours.   Recent Results (from the past 240 hour(s))  Resp Panel by RT-PCR (Flu A&B, Covid) Nasopharyngeal Swab     Status: None   Collection Time: 01/20/20  4:43 PM   Specimen: Nasopharyngeal Swab; Nasopharyngeal(NP) swabs in vial transport medium  Result Value Ref Range Status   SARS Coronavirus 2 by RT PCR NEGATIVE NEGATIVE Final    Comment: (NOTE) SARS-CoV-2  target nucleic acids are NOT DETECTED.  The SARS-CoV-2 RNA is generally detectable in upper respiratory specimens during the acute phase of infection. The lowest concentration of SARS-CoV-2 viral copies this assay can detect is 138 copies/mL. A negative result does not preclude SARS-Cov-2 infection and should not be used as the sole basis for treatment or other patient management decisions. A negative result may occur with  improper specimen collection/handling, submission of specimen other than nasopharyngeal swab, presence of viral mutation(s) within the areas targeted by this assay, and inadequate number of viral copies(<138 copies/mL). A negative result must be combined with clinical observations, patient history, and epidemiological information. The expected result is Negative.  Fact Sheet for Patients:  BloggerCourse.com  Fact Sheet for Healthcare Providers:  SeriousBroker.it  This test is no t yet approved or cleared by the [place] FDA and  has been authorized for detection and/or diagnosis of SARS-CoV-2 by FDA under an Emergency Use Authorization (EUA). This EUA will remain  in effect (meaning this test can be used) for the duration of the COVID-19 declaration under Section 564(b)(1) of the Act, 21 U.S.C.section 360bbb-3(b)(1), unless the authorization is terminated  or revoked sooner.       Influenza A by PCR NEGATIVE NEGATIVE Final   Influenza B by PCR NEGATIVE NEGATIVE Final    Comment: (NOTE) The Xpert Xpress SARS-CoV-2/FLU/RSV plus assay is intended  as an aid in the diagnosis of influenza from Nasopharyngeal swab specimens and should not be used as a sole basis for treatment. Nasal washings and aspirates are unacceptable for Xpert Xpress SARS-CoV-2/FLU/RSV testing.  Fact Sheet for Patients: BloggerCourse.com  Fact Sheet for Healthcare Providers: SeriousBroker.it  This test is not yet approved or cleared by the [place] FDA and has been authorized for detection and/or diagnosis of SARS-CoV-2 by FDA under an Emergency Use Authorization (EUA). This EUA will remain in effect (meaning this test can be used) for the duration of the COVID-19 declaration under Section 564(b)(1) of the Act, 21 U.S.C. section 360bbb-3(b)(1), unless the authorization is terminated or revoked.  Performed at [facility], [address] [place], [place] [address]   MRSA PCR Screening     Status: None   Collection Time: 01/21/20  8:22 AM   Specimen: Nasopharyngeal  Result Value Ref Range Status   MRSA by PCR NEGATIVE NEGATIVE Final    Comment:        The GeneXpert MRSA Assay (FDA approved for NASAL specimens only), is one component of a comprehensive MRSA colonization surveillance program. It is not intended to diagnose MRSA infection nor to guide or monitor treatment for MRSA infections. Performed at [facility], [address] [place], [place] [address]          Radiology Studies: DG CHEST PORT 1 VIEW  Result Date: 01/24/2020 CLINICAL DATA:  Opiate overdose EXAM: PORTABLE CHEST 1 VIEW COMPARISON:  01/20/2020 FINDINGS: Patchy bilateral interstitial and alveolar airspace opacities. No pleural effusion or pneumothorax. Stable cardiomediastinal silhouette. No aggressive osseous lesion. IMPRESSION: Patchy bilateral interstitial and alveolar airspace opacities concerning for multilobar pneumonia including aspiration. Electronically Signed   By: Elige Ko   On: 01/24/2020 15:20        Scheduled Meds: . enoxaparin (LOVENOX) injection  40 mg Subcutaneous Q24H  . mouth rinse  15 mL Mouth Rinse BID  . meloxicam  15 mg Oral Daily  . OXcarbazepine  150 mg Oral BID  . potassium chloride  40 mEq Oral BID  .  sertraline  25 mg Oral Daily   Continuous Infusions: . ampicillin-sulbactam (UNASYN) IV 3 g (01/26/20 0510)     LOS: 5 days        Kathlen Mody, MD [facility]   To contact the attending provider between 7A-7P or the covering provider during after hours 7P-7A, please log into the web site [email or web address] and access using universal [place] password for that web site. If you do not have the password, please call the hospital operator.  01/26/2020, 8:47 AM

## 2020-01-26 NOTE — Progress Notes (Signed)
   01/26/20 1532  Assess: MEWS Score  Temp 99.1 F (37.3 C)  BP 125/77  Pulse Rate (!) 101  Resp (!) 34  SpO2 97 %  O2 Device Nasal Cannula  O2 Flow Rate (L/min) 3 L/min  Assess: MEWS Score  MEWS Temp 0  MEWS Systolic 0  MEWS Pulse 1  MEWS RR 2  MEWS LOC 0  MEWS Score 3  MEWS Score Color Yellow  Assess: if the MEWS score is Yellow or Red  Were vital signs taken at a resting state? Yes  Focused Assessment Change from prior assessment (see assessment flowsheet)  Early Detection of Sepsis Score *See Row Information* Low  MEWS guidelines implemented *See Row Information* Yes  Treat  MEWS Interventions Administered scheduled meds/treatments  Pain Scale 0-10  Pain Score 10  Pain Type Acute pain  Pain Location Head  Pain Descriptors / Indicators Aching  Pain Frequency Constant  Pain Onset Progressive  Patients Stated Pain Goal 0  Pain Intervention(s) Medication (See eMAR)  Multiple Pain Sites No  Take Vital Signs  Increase Vital Sign Frequency  Yellow: Q 2hr X 2 then Q 4hr X 2, if remains yellow, continue Q 4hrs  Escalate  MEWS: Escalate Yellow: discuss with charge nurse/RN and consider discussing with provider and RRT  Notify: Charge Nurse/RN  Name of Charge Nurse/RN Notified Danne Baxter  Date Charge Nurse/RN Notified 01/26/20  Time Charge Nurse/RN Notified 1550  Notify: Provider  Provider Name/Title Dr. Blake Divine  Date Provider Notified 01/26/20  Time Provider Notified 1550  Notification Type Page  Notification Reason Change in status  Response No new orders  Date of Provider Response 01/26/20  Time of Provider Response 1630  Document  Patient Outcome Stabilized after interventions  Progress note created (see row info) Yes

## 2020-01-27 NOTE — Progress Notes (Signed)
NT attempted to get patient up to check ambulating oxygen saturation.  Patient refused and stated that he had cramping in his legs and would get up at noon.

## 2020-01-27 NOTE — Plan of Care (Signed)
Plan of care reviewed. Progressing toward goals as expected. 

## 2020-01-27 NOTE — Progress Notes (Signed)
PROGRESS NOTE    Jonathon Dickerson  KGU:[id number] DOB: 15-Jun-1970 DOA: 01/20/2020 PCP: Bing Neighbors, FNP    Chief Complaint  Patient presents with  . Drug Overdose    Brief Narrative:  Jonathon Dickerson is a 50 y.o. male with a history of bipolar disorder and cocaine abuse. He presented secondary to respiratory failure from unintentional likely opiate overdose requiring multiple doses of Narcan and Narcan drip. His course complicated by aspiration pneumonia . He initially required upto 15 lit of [place] oxygen, weaned him off oxygen today.  Repeat CXR shows multifocal pneumonia. Pt seen and examined, worked with PT and weaned him off oxygen.   Assessment & Plan:   Principal Problem:   Aspiration pneumonia (HCC) Active Problems:   Drug overdose   Leukocytosis   AMS (altered mental status)   Prolonged QT interval    Opiate overdose As needed Narcan at this time patient is alert and oriented and answering all questions appropriately. Psychiatry and TOC on board.     Acute respiratory failure with hypoxia probably secondary to opiate overdose and aspiration pneumonia Patient is afebrile , minimal leukocytosis,  Patient was started on Unasyn , discontinue after today's dose.   Pt initially was on HF oxygen at one point we were able to wean him off oxygen,. Worked with PT , weaned him off oxygen.  Repeat CXR  Showed multifocal pneumonia.  Marland Kitchen    AKI Probably secondary to dehydration.  Improving with IV fluids.  Continue the same.  Bipolar disorder:  Anxiety Requested,  psychiatry consult for medication assistance.  12 lead EKG to check QTC is 449. Started trileptal and zoloft. Added trazodone for insomnia   Hypokalemia:  Replaced.    Leukocytosis probably from aspiration pneumonia, improving with antibiotics.   .  DVT prophylaxis: (Lovenox) Code Status: (Full code) Family Communication: none at bedside.  Disposition:   Status is: Inpatient  Remains inpatient  appropriate because:IV treatments appropriate due to intensity of illness or inability to take PO and Inpatient level of care appropriate due to severity of illness   Dispo: The patient is from: Home              Anticipated d/c is to: Home              Anticipated d/c date is: 1 day              Patient currently is not medically stable to d/c.       Consultants:   None.    Procedures: none.   Antimicrobials:  Antibiotics Given (last 72 hours)    Date/Time Action Medication Dose Rate   01/24/20 2109 New Bag/Given   Ampicillin-Sulbactam (UNASYN) 3 g in sodium chloride 0.9 % 100 mL IVPB 3 g 200 mL/hr   01/25/20 0546 New Bag/Given   Ampicillin-Sulbactam (UNASYN) 3 g in sodium chloride 0.9 % 100 mL IVPB 3 g 200 mL/hr   01/25/20 1329 New Bag/Given   Ampicillin-Sulbactam (UNASYN) 3 g in sodium chloride 0.9 % 100 mL IVPB 3 g 200 mL/hr   01/25/20 2312 New Bag/Given   Ampicillin-Sulbactam (UNASYN) 3 g in sodium chloride 0.9 % 100 mL IVPB 3 g 200 mL/hr   01/26/20 0510 New Bag/Given   Ampicillin-Sulbactam (UNASYN) 3 g in sodium chloride 0.9 % 100 mL IVPB 3 g 200 mL/hr   01/26/20 1445 New Bag/Given   Ampicillin-Sulbactam (UNASYN) 3 g in sodium chloride 0.9 % 100 mL IVPB 3 g 200 mL/hr  01/26/20 2244 New Bag/Given   Ampicillin-Sulbactam (UNASYN) 3 g in sodium chloride 0.9 % 100 mL IVPB 3 g 200 mL/hr   01/27/20 6213 New Bag/Given   Ampicillin-Sulbactam (UNASYN) 3 g in sodium chloride 0.9 % 100 mL IVPB 3 g 200 mL/hr   01/27/20 1316 New Bag/Given   Ampicillin-Sulbactam (UNASYN) 3 g in sodium chloride 0.9 % 100 mL IVPB 3 g 200 mL/hr         Subjective: Not in distress. Plan for discharge tomorrow after today's dose.   Objective: Vitals:   01/26/20 2208 01/27/20 0135 01/27/20 0559 01/27/20 1458  BP: 121/86 126/79 121/68 120/85  Pulse: 97 (!) 102 96 (!) 105  Resp: 16 20 19 15   Temp: 98.5 F (36.9 C) 99.9 F (37.7 C) 98.8 F (37.1 C) 99.5 F (37.5 C)  TempSrc: Oral Oral Oral  Oral  SpO2: 96% 95% 97% 95%  Weight:      Height:        Intake/Output Summary (Last 24 hours) at 01/27/2020 1811 Last data filed at 01/27/2020 1600 Gross per 24 hour  Intake 1796.19 ml  Output 1700 ml  Net 96.19 ml   Filed Weights   01/21/20 0827  Weight: 77.2 kg    Examination:  General exam: Alert and oriented not in distress.  Respiratory system: air entry fair, not on oxygen.  Cardiovascular system: S1S2, RRR, no JVD,  Gastrointestinal system: Abdomen is soft, non tender non distended, bowel sounds wnl.  Central nervous system: alert and oriented.  Extremities: No edema.  Skin: No rashes seen.  Psychiatry:  Mood is appropriate.     Data Reviewed: I have personally reviewed following labs and imaging studies  CBC: Recent Labs  Lab 01/21/20 0329 01/23/20 0312 01/24/20 1051 01/26/20 0552  WBC 6.6  6.6 10.7* 12.8* 11.7*  NEUTROABS  --   --   --  8.6*  HGB 13.0  13.1 12.8* 12.5* 11.7*  HCT 40.3  40.8 39.2 37.4* 34.8*  MCV 93.9  93.8 93.1 91.2 89.2  PLT 329  315 275 295 305    Basic Metabolic Panel: Recent Labs  Lab 01/21/20 0329 01/22/20 0300 01/24/20 1051 01/26/20 0552  NA 137 137 135 135  K 4.1 4.1 3.3* 3.1*  CL 102 101 100 100  CO2 25 23 24 24   GLUCOSE 82 81 147* 120*  BUN 16 16 8 8   CREATININE 1.64*  1.68* 1.07 0.88 0.77  CALCIUM 8.8* 8.4* 8.6* 8.1*    GFR: Estimated Creatinine Clearance: 109.3 mL/min (by C-G formula based on SCr of 0.77 mg/dL).  Liver Function Tests: No results for input(s): AST, ALT, ALKPHOS, BILITOT, PROT, ALBUMIN in the last 168 hours.  CBG: No results for input(s): GLUCAP in the last 168 hours.   Recent Results (from the past 240 hour(s))  Resp Panel by RT-PCR (Flu A&B, Covid) Nasopharyngeal Swab     Status: None   Collection Time: 01/20/20  4:43 PM   Specimen: Nasopharyngeal Swab; Nasopharyngeal(NP) swabs in vial transport medium  Result Value Ref Range Status   SARS Coronavirus 2 by RT PCR NEGATIVE  NEGATIVE Final    Comment: (NOTE) SARS-CoV-2 target nucleic acids are NOT DETECTED.  The SARS-CoV-2 RNA is generally detectable in upper respiratory specimens during the acute phase of infection. The lowest concentration of SARS-CoV-2 viral copies this assay can detect is 138 copies/mL. A negative result does not preclude SARS-Cov-2 infection and should not be used as the sole basis for treatment or other patient  management decisions. A negative result may occur with  improper specimen collection/handling, submission of specimen other than nasopharyngeal swab, presence of viral mutation(s) within the areas targeted by this assay, and inadequate number of viral copies(<138 copies/mL). A negative result must be combined with clinical observations, patient history, and epidemiological information. The expected result is Negative.  Fact Sheet for Patients:  BloggerCourse.com  Fact Sheet for Healthcare Providers:  SeriousBroker.it  This test is no t yet approved or cleared by the [place] FDA and  has been authorized for detection and/or diagnosis of SARS-CoV-2 by FDA under an Emergency Use Authorization (EUA). This EUA will remain  in effect (meaning this test can be used) for the duration of the COVID-19 declaration under Section 564(b)(1) of the Act, 21 U.S.C.section 360bbb-3(b)(1), unless the authorization is terminated  or revoked sooner.       Influenza A by PCR NEGATIVE NEGATIVE Final   Influenza B by PCR NEGATIVE NEGATIVE Final    Comment: (NOTE) The Xpert Xpress SARS-CoV-2/FLU/RSV plus assay is intended as an aid in the diagnosis of influenza from Nasopharyngeal swab specimens and should not be used as a sole basis for treatment. Nasal washings and aspirates are unacceptable for Xpert Xpress SARS-CoV-2/FLU/RSV testing.  Fact Sheet for Patients: BloggerCourse.com  Fact Sheet for Healthcare  Providers: SeriousBroker.it  This test is not yet approved or cleared by the [place] FDA and has been authorized for detection and/or diagnosis of SARS-CoV-2 by FDA under an Emergency Use Authorization (EUA). This EUA will remain in effect (meaning this test can be used) for the duration of the COVID-19 declaration under Section 564(b)(1) of the Act, 21 U.S.C. section 360bbb-3(b)(1), unless the authorization is terminated or revoked.  Performed at [facility], [address], [place], [place] [address]   MRSA PCR Screening     Status: None   Collection Time: 01/21/20  8:22 AM   Specimen: Nasopharyngeal  Result Value Ref Range Status   MRSA by PCR NEGATIVE NEGATIVE Final    Comment:        The GeneXpert MRSA Assay (FDA approved for NASAL specimens only), is one component of a comprehensive MRSA colonization surveillance program. It is not intended to diagnose MRSA infection nor to guide or monitor treatment for MRSA infections. Performed at [facility], [address] [place], [place] [address]          Radiology Studies: No results found.      Scheduled Meds: . enoxaparin (LOVENOX) injection  40 mg Subcutaneous Q24H  . mouth rinse  15 mL Mouth Rinse BID  . meloxicam  15 mg Oral Daily  . OXcarbazepine  150 mg Oral BID  . sertraline  25 mg Oral Daily  . traZODone  50 mg Oral QHS   Continuous Infusions: . ampicillin-sulbactam (UNASYN) IV 3 g (01/27/20 1316)     LOS: 6 days        Kathlen Mody, MD [facility]   To contact the attending provider between 7A-7P or the covering provider during after hours 7P-7A, please log into the web site [email or web address] and access using universal [place] password for that web site. If you do not have the password, please call the hospital operator.  01/27/2020, 6:11 PM

## 2020-01-27 NOTE — Evaluation (Signed)
Physical Therapy Evaluation Patient Details Name: Jonathon Dickerson MRN: [id number] DOB: Jul 09, 1970 Today's Date: 01/27/2020   History of Present Illness  50 y.o. male with a history of bipolar disorder and cocaine abuse. He presented secondary to respiratory failure from unintentional likely opiate overdose requiring multiple doses of Narcan and Narcan drip. His course complicated by aspiration pneumonia . He initially required upto 15 lit of [place] oxygen, weaned him off oxygen today. PMH: Bipolar, anxiety  Clinical Impression  Pt admitted with above diagnosis.  Pt requiring min to mod assist for tranfers and gait. Reports he has some difficulty at baseline, especially when he first gets OOB. SpO2=89-97% on RA   Pt currently with functional limitations due to the deficits listed below (see PT Problem List). Pt will benefit from skilled PT to increase their independence and safety with mobility to allow discharge to the venue listed below.      Follow Up Recommendations Home health PT    Equipment Recommendations  Rolling walker with 5" wheels    Recommendations for Other Services       Precautions / Restrictions Precautions Precautions: Fall Restrictions Weight Bearing Restrictions: No      Mobility  Bed Mobility Overal bed mobility: Needs Assistance Bed Mobility: Rolling;Sidelying to Sit;Sit to Supine Rolling: Min guard Sidelying to sit: Min guard   Sit to supine: Min guard   General bed mobility comments: cues for log roll technique (pt reports he usually does this to reduce pain)    Transfers Overall transfer level: Needs assistance Equipment used: Rolling walker (2 wheeled) Transfers: Sit to/from Stand Sit to Stand: Min assist;From elevated surface;Mod assist         General transfer comment: cues for safety and hand placement. decr assist on second trial  Ambulation/Gait Ambulation/Gait assistance: Min assist;+2 safety/equipment Gait Distance (Feet): 40  Feet Assistive device: Rolling walker (2 wheeled) Gait Pattern/deviations: Step-through pattern;Decreased stride length;Narrow base of support;Trunk flexed     General Gait Details: assist to balance and maneuver RW. cues for RW position, posture and safety  Stairs            Wheelchair Mobility    Modified Rankin (Stroke Patients Only)       Balance Overall balance assessment: Needs assistance Sitting-balance support: No upper extremity supported;Feet supported Sitting balance-Leahy Scale: Fair Sitting balance - Comments: LOB posteriorly x1, pt recovered with min/guard     Standing balance-Leahy Scale: Poor Standing balance comment: reliant on UEs                             Pertinent Vitals/Pain Pain Assessment: 0-10 Pain Score: 5  Pain Location: back Pain Descriptors / Indicators: Sore Pain Intervention(s): Limited activity within patient's tolerance;Monitored during session;Repositioned    Home Living Family/patient expects to be discharged to:: Other (Comment) [facility]) Living Arrangements: Alone                    Prior Function Level of Independence: Independent         Comments: pt reports he had spine surgery last year and used RW and cane. states he gae the RW away, later states he has one possibky at his Dad's     Hand Dominance        Extremity/Trunk Assessment   Upper Extremity Assessment Upper Extremity Assessment: Defer to OT evaluation    Lower Extremity Assessment Lower Extremity Assessment: Generalized weakness  Communication   Communication: No difficulties  Cognition Arousal/Alertness: Awake/alert Behavior During Therapy: WFL for tasks assessed/performed Overall Cognitive Status: Within Functional Limits for tasks assessed                                        General Comments      Exercises     Assessment/Plan    PT Assessment Patient needs continued PT services  PT Problem  List Decreased strength;Decreased mobility;Decreased activity tolerance;Decreased balance;Decreased knowledge of use of DME;Decreased coordination       PT Treatment Interventions DME instruction;Therapeutic activities;Gait training;Functional mobility training;Therapeutic exercise;Patient/family education    PT Goals (Current goals can be found in the Care Plan section)  Acute Rehab PT Goals Patient Stated Goal: get better PT Goal Formulation: With patient Time For Goal Achievement: 02/10/20 Potential to Achieve Goals: Good    Frequency Min 3X/week   Barriers to discharge        Co-evaluation               AM-PAC PT "6 Clicks" Mobility  Outcome Measure Help needed turning from your back to your side while in a flat bed without using bedrails?: A Little Help needed moving from lying on your back to sitting on the side of a flat bed without using bedrails?: A Little Help needed moving to and from a bed to a chair (including a wheelchair)?: A Little Help needed standing up from a chair using your arms (e.g., wheelchair or bedside chair)?: A Little Help needed to walk in hospital room?: A Little Help needed climbing 3-5 steps with a railing? : A Lot 6 Click Score: 17    End of Session Equipment Utilized During Treatment: Gait belt Activity Tolerance: Patient tolerated treatment well Patient left: with call bell/phone within reach;in bed;with bed alarm set Nurse Communication: Mobility status PT Visit Diagnosis: Other abnormalities of gait and mobility (R26.89)    Time: 9678-9381 PT Time Calculation (min) (ACUTE ONLY): 19 min   Charges:   PT Evaluation $PT Eval Low Complexity: 1 Low          Malai Lady, PT  Acute Rehab Dept ([facility]) [phone] Pager [phone]  01/27/2020   [facility] 01/27/2020, 1:03 PM

## 2020-01-27 NOTE — Progress Notes (Signed)
SATURATION QUALIFICATIONS:   Patient Saturations on Room Air at Rest = 97%  Patient Saturations on Room Air while Ambulating = 89%

## 2020-01-28 LAB — BASIC METABOLIC PANEL
Anion gap: 9 (ref 5–15)
BUN: 8 mg/dL (ref 6–20)
CO2: 25 mmol/L (ref 22–32)
Calcium: 8.1 mg/dL — ABNORMAL LOW (ref 8.9–10.3)
Chloride: 101 mmol/L (ref 98–111)
Creatinine, Ser: 0.76 mg/dL (ref 0.61–1.24)
GFR, Estimated: >60 mL/min (ref >60)
Glucose, Bld: 93 mg/dL (ref 70–99)
Potassium: 3.9 mmol/L (ref 3.5–5.1)
Sodium: 135 mmol/L (ref 135–145)

## 2020-01-28 LAB — CREATININE, SERUM
Creatinine, Ser: 0.84 mg/dL (ref 0.61–1.24)
GFR, Estimated: >60 mL/min (ref >60)

## 2020-01-28 MED ORDER — OXCARBAZEPINE 150 MG PO TABS: 150.0000 mg | ORAL_TABLET | Freq: Two times a day (BID) | ORAL | 1 refills | Status: AC

## 2020-01-28 MED ORDER — SERTRALINE HCL 25 MG PO TABS: 25.0000 mg | ORAL_TABLET | Freq: Every day | ORAL | 1 refills | Status: AC

## 2020-01-28 MED ORDER — IPRATROPIUM-ALBUTEROL 0.5-2.5 (3) MG/3ML IN SOLN: 3.0000 mL | Freq: Four times a day (QID) | RESPIRATORY_TRACT | 2 refills | Status: AC | PRN

## 2020-01-28 MED ORDER — SENNOSIDES-DOCUSATE SODIUM 8.6-50 MG PO TABS: 1.0000 | ORAL_TABLET | Freq: Every evening | ORAL | 1 refills | Status: AC | PRN

## 2020-01-28 MED ORDER — TRAZODONE HCL 50 MG PO TABS: 50.0000 mg | ORAL_TABLET | Freq: Every day | ORAL | 0 refills | Status: AC

## 2020-01-28 MED ORDER — AMOXICILLIN-POT CLAVULANATE 875-125 MG PO TABS: 1.0000 | ORAL_TABLET | Freq: Two times a day (BID) | ORAL | 0 refills | Status: AC

## 2020-01-28 NOTE — Discharge Summary (Signed)
Physician Discharge Summary  Jonathon Dickerson Dickerson DOB: 12/03/1970 DOA: 01/20/2020  PCP: Bing NeighborsHarris, Kimberly S, FNP  Admit date: 01/20/2020 Discharge date: 01/28/2020  Admitted From: Home.  Disposition: Home.   Recommendations for Outpatient Follow-up:  1. Follow up with PCP in 1-2 weeks 2. Please obtain BMP/CBC in one week   Discharge Condition: stable.  CODE STATUS: Full code.  Diet recommendation: Heart Healthy   Brief/Interim Summary: Jonathon RearLawrence J Dickerson a 49 y.o.male with a history of bipolar disorder and cocaine abuse. He presented secondary to respiratory failure from unintentional likely opiate overdose requiring multiple doses of Narcan and Narcan drip.His course complicated by aspiration pneumonia . He initially required upto 15 lit of [place] oxygen, weaned him off oxygen today.  Though repeat CXR shows multifocal pneumonia , he has been off oxygen. He completed 7 days of IV antibiotics.    Discharge Diagnoses:  Principal Problem:   Aspiration pneumonia (HCC) Active Problems:   Drug overdose   Leukocytosis   AMS (altered mental status)   Prolonged QT interval   Opiate overdose Resolved.  Psychiatry and TOC on board.     Acute respiratory failure with hypoxia probably secondary to opiate overdose and aspiration pneumonia Patient is afebrile , minimal leukocytosis,  Patient was started on Unasyn ,completed 7 days of IV antibiotics.    Pt initially was on HF oxygen at one point we were able to wean him off oxygen,. Worked with PT , weaned him off oxygen.   Marland Kitchen.    AKI Resolved with hydration.   Bipolar disorder:  Anxiety  Psychiatry consult for medication assistance.  12 lead EKG to check QTC is 449. Started trileptal and zoloft. Added trazodone for insomnia   Hypokalemia:  Replaced.    Leukocytosis probably from aspiration pneumonia, improving with antibiotics.    Discharge Instructions  Discharge Instructions    Diet - low sodium  heart healthy   Complete by: As directed    Discharge instructions   Complete by: As directed    Please follow up with   Increase activity slowly   Complete by: As directed      Allergies as of 01/28/2020      Reactions   Hornet Venom Anaphylaxis   Pt reported allergy to all bees.   Other Shortness Of Breath, Swelling   BEES      Medication List    STOP taking these medications   ALPRAZolam 0.25 MG tablet Commonly known as: Xanax     TAKE these medications   amoxicillin-clavulanate 875-125 MG tablet Commonly known as: Augmentin Take 1 tablet by mouth every 12 (twelve) hours for 2 days.   gabapentin 300 MG capsule Commonly known as: NEURONTIN Take 1 capsule (300 mg total) by mouth 3 (three) times daily.   ipratropium-albuterol 0.5-2.5 (3) MG/3ML Soln Commonly known as: DUONEB Take 3 mLs by nebulization every 6 (six) hours as needed.   meloxicam 15 MG tablet Commonly known as: MOBIC TAKE 1 TABLET BY MOUTH ONCE DAILY   OXcarbazepine 150 MG tablet Commonly known as: TRILEPTAL Take 1 tablet (150 mg total) by mouth 2 (two) times daily.   senna-docusate 8.6-50 MG tablet Commonly known as: Senokot-S Take 1 tablet by mouth at bedtime as needed for mild constipation.   sertraline 25 MG tablet Commonly known as: ZOLOFT Take 1 tablet (25 mg total) by mouth daily.   traZODone 50 MG tablet Commonly known as: DESYREL Take 1 tablet (50 mg total) by mouth at bedtime.  Follow-up Information    Bing Neighbors, FNP. Schedule an appointment as soon as possible for a visit in 1 week(s).   Specialty: Family Medicine Contact information: [address] [place] [address] [phone]              Allergies  Allergen Reactions  . Hornet Venom Anaphylaxis    Pt reported allergy to all bees.  . Other Shortness Of Breath and Swelling    BEES    Consultations:  Psychiatry.    Procedures/Studies: CT Head Wo Contrast  Result Date:  01/20/2020 CLINICAL DATA:  51 year old male with altered mental status. EXAM: CT HEAD WITHOUT CONTRAST TECHNIQUE: Contiguous axial images were obtained from the base of the skull through the vertex without intravenous contrast. COMPARISON:  None. FINDINGS: Brain: The ventricles and sulci appropriate size for patient's age. The gray-white matter discrimination is preserved. There is no acute intracranial hemorrhage. No mass effect or midline shift. No extra-axial fluid collection. Vascular: No hyperdense vessel or unexpected calcification. Skull: Normal. Negative for fracture or focal lesion. Sinuses/Orbits: Mild mucoperiosteal thickening of paranasal sinuses. No air-fluid level. The mastoid air cells are clear. Other: None IMPRESSION: Unremarkable noncontrast CT of the brain. Electronically Signed   By: Elgie Collard M.D.   On: 01/20/2020 23:26   DG CHEST PORT 1 VIEW  Result Date: 01/24/2020 CLINICAL DATA:  Opiate overdose EXAM: PORTABLE CHEST 1 VIEW COMPARISON:  01/20/2020 FINDINGS: Patchy bilateral interstitial and alveolar airspace opacities. No pleural effusion or pneumothorax. Stable cardiomediastinal silhouette. No aggressive osseous lesion. IMPRESSION: Patchy bilateral interstitial and alveolar airspace opacities concerning for multilobar pneumonia including aspiration. Electronically Signed   By: Elige Ko   On: 01/24/2020 15:20   DG Chest Port 1 View  Result Date: 01/20/2020 CLINICAL DATA:  50 year old male with hypoxia. EXAM: PORTABLE CHEST 1 VIEW COMPARISON:  Chest radiograph dated 10/16/2008. FINDINGS: Right lung base hazy densities may represent atelectasis or atypical infiltrate. Clinical correlation is recommended. No focal consolidation, pleural effusion, pneumothorax. The cardiac silhouette is within limits. No acute osseous pathology. Cervical spine ACDF. IMPRESSION: Right lung base atelectasis versus atypical infiltrate. Electronically Signed   By: Elgie Collard M.D.   On:  01/20/2020 17:28       Subjective: Patient does not want to go to home today.   Discharge Exam: Vitals:   01/27/20 2057 01/28/20 0458  BP: 135/85 115/81  Pulse: (!) 102 (!) 104  Resp: 16 16  Temp: 98.6 F (37 C) 98.1 F (36.7 C)  SpO2: 94% 94%   Vitals:   01/27/20 0559 01/27/20 1458 01/27/20 2057 01/28/20 0458  BP: 121/68 120/85 135/85 115/81  Pulse: 96 (!) 105 (!) 102 (!) 104  Resp: 19 15 16 16   Temp: 98.8 F (37.1 C) 99.5 F (37.5 C) 98.6 F (37 C) 98.1 F (36.7 C)  TempSrc: Oral Oral Oral Oral  SpO2: 97% 95% 94% 94%  Weight:      Height:        General: Pt is alert, awake, not in acute distress Cardiovascular: RRR, S1/S2 +, no rubs, no gallops Respiratory: CTA bilaterally, no wheezing, no rhonchi Abdominal: Soft, NT, ND, bowel sounds + Extremities: no edema, no cyanosis    The results of significant diagnostics from this hospitalization (including imaging, microbiology, ancillary and laboratory) are listed below for reference.     Microbiology: Recent Results (from the past 240 hour(s))  Resp Panel by RT-PCR (Flu A&B, Covid) Nasopharyngeal Swab     Status: None  Collection Time: 01/20/20  4:43 PM   Specimen: Nasopharyngeal Swab; Nasopharyngeal(NP) swabs in vial transport medium  Result Value Ref Range Status   SARS Coronavirus 2 by RT PCR NEGATIVE NEGATIVE Final    Comment: (NOTE) SARS-CoV-2 target nucleic acids are NOT DETECTED.  The SARS-CoV-2 RNA is generally detectable in upper respiratory specimens during the acute phase of infection. The lowest concentration of SARS-CoV-2 viral copies this assay can detect is 138 copies/mL. A negative result does not preclude SARS-Cov-2 infection and should not be used as the sole basis for treatment or other patient management decisions. A negative result may occur with  improper specimen collection/handling, submission of specimen other than nasopharyngeal swab, presence of viral mutation(s) within  the areas targeted by this assay, and inadequate number of viral copies(<138 copies/mL). A negative result must be combined with clinical observations, patient history, and epidemiological information. The expected result is Negative.  Fact Sheet for Patients:  BloggerCourse.comhttps://www.fda.gov/media/152166/download  Fact Sheet for Healthcare Providers:  SeriousBroker.ithttps://www.fda.gov/media/152162/download  This test is no t yet approved or cleared by the [place] FDA and  has been authorized for detection and/or diagnosis of SARS-CoV-2 by FDA under an Emergency Use Authorization (EUA). This EUA will remain  in effect (meaning this test can be used) for the duration of the COVID-19 declaration under Section 564(b)(1) of the Act, 21 U.S.C.section 360bbb-3(b)(1), unless the authorization is terminated  or revoked sooner.       Influenza A by PCR NEGATIVE NEGATIVE Final   Influenza B by PCR NEGATIVE NEGATIVE Final    Comment: (NOTE) The Xpert Xpress SARS-CoV-2/FLU/RSV plus assay is intended as an aid in the diagnosis of influenza from Nasopharyngeal swab specimens and should not be used as a sole basis for treatment. Nasal washings and aspirates are unacceptable for Xpert Xpress SARS-CoV-2/FLU/RSV testing.  Fact Sheet for Patients: BloggerCourse.comhttps://www.fda.gov/media/152166/download  Fact Sheet for Healthcare Providers: SeriousBroker.ithttps://www.fda.gov/media/152162/download  This test is not yet approved or cleared by the [place] FDA and has been authorized for detection and/or diagnosis of SARS-CoV-2 by FDA under an Emergency Use Authorization (EUA). This EUA will remain in effect (meaning this test can be used) for the duration of the COVID-19 declaration under Section 564(b)(1) of the Act, 21 U.S.C. section 360bbb-3(b)(1), unless the authorization is terminated or revoked.  Performed at [facility], [address] [place], [place] [address]   MRSA PCR Screening     Status: None    Collection Time: 01/21/20  8:22 AM   Specimen: Nasopharyngeal  Result Value Ref Range Status   MRSA by PCR NEGATIVE NEGATIVE Final    Comment:        The GeneXpert MRSA Assay (FDA approved for NASAL specimens only), is one component of a comprehensive MRSA colonization surveillance program. It is not intended to diagnose MRSA infection nor to guide or monitor treatment for MRSA infections. Performed at [facility], [address] [place], [place] [address]      Labs: BNP (last 3 results) No results for input(s): BNP in the last 8760 hours. Basic Metabolic Panel: Recent Labs  Lab 01/22/20 0300 01/24/20 1051 01/26/20 0552 01/28/20 0551  NA 137 135 135  --   K 4.1 3.3* 3.1*  --   CL 101 100 100  --   CO2 23 24 24   --   GLUCOSE 81 147* 120*  --   BUN 16 8 8   --   CREATININE 1.07 0.88 0.77 0.84  CALCIUM 8.4* 8.6* 8.1*  --  Liver Function Tests: No results for input(s): AST, ALT, ALKPHOS, BILITOT, PROT, ALBUMIN in the last 168 hours. No results for input(s): LIPASE, AMYLASE in the last 168 hours. No results for input(s): AMMONIA in the last 168 hours. CBC: Recent Labs  Lab 01/23/20 0312 01/24/20 1051 01/26/20 0552  WBC 10.7* 12.8* 11.7*  NEUTROABS  --   --  8.6*  HGB 12.8* 12.5* 11.7*  HCT 39.2 37.4* 34.8*  MCV 93.1 91.2 89.2  PLT 275 295 305   Cardiac Enzymes: No results for input(s): CKTOTAL, CKMB, CKMBINDEX, TROPONINI in the last 168 hours. BNP: Invalid input(s): POCBNP CBG: No results for input(s): GLUCAP in the last 168 hours. D-Dimer No results for input(s): DDIMER in the last 72 hours. Hgb A1c No results for input(s): HGBA1C in the last 72 hours. Lipid Profile No results for input(s): CHOL, HDL, LDLCALC, TRIG, CHOLHDL, LDLDIRECT in the last 72 hours. Thyroid function studies No results for input(s): TSH, T4TOTAL, T3FREE, THYROIDAB in the last 72 hours.  Invalid input(s): FREET3 Anemia work up No results for input(s):  VITAMINB12, FOLATE, FERRITIN, TIBC, IRON, RETICCTPCT in the last 72 hours. Urinalysis    Component Value Date/Time   COLORURINE YELLOW 10/12/2017 0345   APPEARANCEUR CLEAR 10/12/2017 0345   LABSPEC >1.030 (H) 10/12/2017 0345   PHURINE 5.5 10/12/2017 0345   GLUCOSEU NEGATIVE 10/12/2017 0345   HGBUR SMALL (A) 10/12/2017 0345   BILIRUBINUR NEGATIVE 10/12/2017 0345   KETONESUR NEGATIVE 10/12/2017 0345   PROTEINUR NEGATIVE 10/12/2017 0345   NITRITE NEGATIVE 10/12/2017 0345   LEUKOCYTESUR NEGATIVE 10/12/2017 0345   Sepsis Labs Invalid input(s): PROCALCITONIN,  WBC,  LACTICIDVEN Microbiology Recent Results (from the past 240 hour(s))  Resp Panel by RT-PCR (Flu A&B, Covid) Nasopharyngeal Swab     Status: None   Collection Time: 01/20/20  4:43 PM   Specimen: Nasopharyngeal Swab; Nasopharyngeal(NP) swabs in vial transport medium  Result Value Ref Range Status   SARS Coronavirus 2 by RT PCR NEGATIVE NEGATIVE Final    Comment: (NOTE) SARS-CoV-2 target nucleic acids are NOT DETECTED.  The SARS-CoV-2 RNA is generally detectable in upper respiratory specimens during the acute phase of infection. The lowest concentration of SARS-CoV-2 viral copies this assay can detect is 138 copies/mL. A negative result does not preclude SARS-Cov-2 infection and should not be used as the sole basis for treatment or other patient management decisions. A negative result may occur with  improper specimen collection/handling, submission of specimen other than nasopharyngeal swab, presence of viral mutation(s) within the areas targeted by this assay, and inadequate number of viral copies(<138 copies/mL). A negative result must be combined with clinical observations, patient history, and epidemiological information. The expected result is Negative.  Fact Sheet for Patients:  BloggerCourse.com  Fact Sheet for Healthcare Providers:  SeriousBroker.it  This test  is no t yet approved or cleared by the [place] FDA and  has been authorized for detection and/or diagnosis of SARS-CoV-2 by FDA under an Emergency Use Authorization (EUA). This EUA will remain  in effect (meaning this test can be used) for the duration of the COVID-19 declaration under Section 564(b)(1) of the Act, 21 U.S.C.section 360bbb-3(b)(1), unless the authorization is terminated  or revoked sooner.       Influenza A by PCR NEGATIVE NEGATIVE Final   Influenza B by PCR NEGATIVE NEGATIVE Final    Comment: (NOTE) The Xpert Xpress SARS-CoV-2/FLU/RSV plus assay is intended as an aid in the diagnosis of influenza from Nasopharyngeal swab specimens and should not  be used as a sole basis for treatment. Nasal washings and aspirates are unacceptable for Xpert Xpress SARS-CoV-2/FLU/RSV testing.  Fact Sheet for Patients: BloggerCourse.com  Fact Sheet for Healthcare Providers: SeriousBroker.it  This test is not yet approved or cleared by the [place] FDA and has been authorized for detection and/or diagnosis of SARS-CoV-2 by FDA under an Emergency Use Authorization (EUA). This EUA will remain in effect (meaning this test can be used) for the duration of the COVID-19 declaration under Section 564(b)(1) of the Act, 21 U.S.C. section 360bbb-3(b)(1), unless the authorization is terminated or revoked.  Performed at [facility], [address], [place], [place] [address]   MRSA PCR Screening     Status: None   Collection Time: 01/21/20  8:22 AM   Specimen: Nasopharyngeal  Result Value Ref Range Status   MRSA by PCR NEGATIVE NEGATIVE Final    Comment:        The GeneXpert MRSA Assay (FDA approved for NASAL specimens only), is one component of a comprehensive MRSA colonization surveillance program. It is not intended to diagnose MRSA infection nor to guide or monitor treatment for MRSA  infections. Performed at [facility], [address], [place], [place] [address]      Time coordinating discharge: 33 minutes.   SIGNED:   Kathlen Mody, MD  [facility] 01/28/2020, 10:45 AM

## 2020-01-29 ENCOUNTER — Telehealth: Payer: Self-pay

## 2020-01-29 NOTE — Telephone Encounter (Signed)
Transition Care Management Unsuccessful Follow-up Telephone Call  Date of discharge and from where:  01/28/2020, [facility]   Attempts:  1st Attempt  Reason for unsuccessful TCM follow-up call:  Left voice message - call placed to # [phone]. Call back requested to this CM.  Need to discuss scheduling a hospital follow up appointment,  PCP is listed as Jerrilyn Cairo, NP but he has not seen her in 2+ years.

## 2020-01-30 ENCOUNTER — Telehealth: Payer: Self-pay

## 2020-01-30 NOTE — Telephone Encounter (Signed)
Transition Care Management Follow-up Telephone Call  Date of discharge and from where: 01/28/2020, [facility]   How have you been since you were released from the hospital? He said he is doing " pretty good."   Any questions or concerns? No  Items Reviewed:  Did the pt receive and understand the discharge instructions provided? Yes   Medications obtained and verified? Yes  - he said he has all medications and did not have any questions about the med regime. However, he does not have a nebulizer  Other? No   Any new allergies since your discharge? No   Do you have support at home? Yes   Home Care and Equipment/Supplies: Were home health services ordered? no If so, what is the name of the agency? n/a Has the agency set up a time to come to the patient's home? not applicable Were any new equipment or medical supplies ordered?  No What is the name of the medical supply agency? n/a Were you able to get the supplies/equipment?  A nebulizer was not ordered and he didn't have one prior to his hospitalization.  Do you have any questions related to the use of the equipment or supplies? No, n/a  Functional Questionnaire: (I = Independent and D = Dependent) ADLs: independent  Follow up appointments reviewed:   PCP Hospital f/u appt confirmed? Yes  - Dr Earlene Plater 02/05/2020 - the appt information was text to him as he requested  Specialist Hospital f/u appt confirmed? No , none scheduled at this time   Are transportation arrangements needed? No   If their condition worsens, is the pt aware to call PCP or go to the Emergency Dept.?yes  Was the patient provided with contact information for the PCP's office or ED? Yes, he was given the number for [facility]  Was to pt encouraged to call back with questions or concerns? yes

## 2020-02-05 ENCOUNTER — Ambulatory Visit: Payer: Self-pay | Admitting: Physician Assistant

## 2021-04-09 ENCOUNTER — Emergency Department (HOSPITAL_COMMUNITY)
Admission: EM | Admit: 2021-04-09 | Discharge: 2021-04-09 | Payer: No Typology Code available for payment source | Attending: Emergency Medicine | Admitting: Emergency Medicine

## 2021-04-09 DIAGNOSIS — W1839XA Other fall on same level, initial encounter: Secondary | ICD-10-CM | POA: Insufficient documentation

## 2021-04-09 DIAGNOSIS — S01511A Laceration without foreign body of lip, initial encounter: Secondary | ICD-10-CM | POA: Diagnosis not present

## 2021-04-09 DIAGNOSIS — Z23 Encounter for immunization: Secondary | ICD-10-CM | POA: Diagnosis not present

## 2021-04-09 DIAGNOSIS — S00501A Unspecified superficial injury of lip, initial encounter: Secondary | ICD-10-CM | POA: Diagnosis present

## 2021-04-09 MED ORDER — LIDOCAINE-EPINEPHRINE 2 %-1:100000 IJ SOLN
20.0000 mL | Freq: Once | INTRAMUSCULAR | Status: AC
  Administered 2021-04-09: 20 mL via INTRADERMAL
  Filled 2021-04-09: qty 1

## 2021-04-09 MED ORDER — TETANUS-DIPHTH-ACELL PERTUSSIS 5-2.5-18.5 LF-MCG/0.5 IM SUSY
0.5000 mL | PREFILLED_SYRINGE | Freq: Once | INTRAMUSCULAR | Status: AC
  Administered 2021-04-09: 0.5 mL via INTRAMUSCULAR
  Filled 2021-04-09: qty 0.5

## 2021-04-09 NOTE — ED Notes (Signed)
I provided reinforced discharge education based off of discharge instructions. Pt acknowledged and understood my education. Pt had no further questions/concerns for provider/myself.  °

## 2021-04-09 NOTE — ED Triage Notes (Signed)
Patient brought in by [employer] in handcuffs. Patient has a lip injury due to a fall. ?

## 2021-04-09 NOTE — ED Triage Notes (Signed)
Unable to finish triage. Patient will not answer any questions ?

## 2021-04-09 NOTE — Discharge Instructions (Signed)
Maintain a soft diet.  Do not eat any food that requires you to open your mouth widely like [employer] sandwich or apple. ?You may use ice on your mouth to reduce swelling over a towel 2-3 times a day 20 minutes at a time.  Tylenol or Motrin Motrin, 500 mg and 400 mg respectively every 6 hours for pain relief. ? ?The sutures are dissolvable and you will not need them removed. ? ?WOUND CARE ? ? Keep area clean and dry for 24 hours. Do not remove ?bandage, if applied. ? After 24 hours, remove bandage and wash wound ?gently with mild soap and warm water. Reapply ?a new bandage after cleaning wound, if directed. ? Continue daily cleansing with soap and water until ?stitches/staples are removed. ? Do not apply any ointments or creams to the wound ?while stitches/staples are in place, as this may cause ?delayed healing. ? Seek medical careif you experience any of the following ?signs of infection: Swelling, redness, pus drainage, ?streaking, fever >101.0 F ? Seek care if you experience excessive bleeding ?that does not stop after 15-20 minutes of constant, firm ?pressure. ? ? ?

## 2021-04-09 NOTE — ED Provider Notes (Signed)
?[facility]-EMERGENCY DEPT ?Provider Note ? ? ?CSN: [id number] ?Arrival date & time: 04/09/21  2202 ? ?  ? ?History ? ?No chief complaint on file. ? ? ?Jonathon Dickerson is a 51 y.o. male BIB [facility] department.  Patient suffered a laceration to the right upper lip during his payment.  This occurred about 2 hours ago.  Patient states he is not up-to-date on his tetanus vaccination.  He denies any dental injuries or mouth pain outside of lip laceration. ? ?HPI ? ?  ? ?Home Medications ?Prior to Admission medications   ?Medication Sig Start Date End Date Taking? Authorizing Provider  ?gabapentin (NEURONTIN) 300 MG capsule Take 1 capsule (300 mg total) by mouth 3 (three) times daily. 12/05/17   Bing Neighbors, FNP  ?ipratropium-albuterol (DUONEB) 0.5-2.5 (3) MG/3ML SOLN Take 3 mLs by nebulization every 6 (six) hours as needed. 01/28/20   Kathlen Mody, MD  ?meloxicam (MOBIC) 15 MG tablet TAKE 1 TABLET BY MOUTH ONCE DAILY 01/25/18   Bing Neighbors, FNP  ?OXcarbazepine (TRILEPTAL) 150 MG tablet Take 1 tablet (150 mg total) by mouth 2 (two) times daily. 01/28/20   Kathlen Mody, MD  ?senna-docusate (SENOKOT-S) 8.6-50 MG tablet Take 1 tablet by mouth at bedtime as needed for mild constipation. 01/28/20   Kathlen Mody, MD  ?sertraline (ZOLOFT) 25 MG tablet Take 1 tablet (25 mg total) by mouth daily. 01/28/20   Kathlen Mody, MD  ?traZODone (DESYREL) 50 MG tablet Take 1 tablet (50 mg total) by mouth at bedtime. 01/28/20   Kathlen Mody, MD  ?   ? ?Allergies    ?Hornet venom and Other   ? ?Review of Systems   ?Review of Systems ? ?Physical Exam ?Updated Vital Signs ?BP 130/88 (BP Location: Right Arm)   Pulse 60   Temp 98 ?F (36.7 ?C) (Oral)   Resp 18   SpO2 100%  ?Physical Exam ?Vitals and nursing note reviewed.  ?Constitutional:   ?   General: He is not in acute distress. ?   Appearance: He is well-developed. He is not diaphoretic.  ?HENT:  ?   Head: Normocephalic.  ?   Comments:  Left upper lip is split in twain- through and through  ?Small edge of vermillion border is involved ?Eyes:  ?   General: No scleral icterus. ?   Conjunctiva/sclera: Conjunctivae normal.  ?Cardiovascular:  ?   Rate and Rhythm: Normal rate and regular rhythm.  ?   Heart sounds: Normal heart sounds.  ?Pulmonary:  ?   Effort: Pulmonary effort is normal. No respiratory distress.  ?   Breath sounds: Normal breath sounds.  ?Abdominal:  ?   Palpations: Abdomen is soft.  ?   Tenderness: There is no abdominal tenderness.  ?Musculoskeletal:  ?   Cervical back: Normal range of motion and neck supple.  ?Skin: ?   General: Skin is warm and dry.  ?Neurological:  ?   Mental Status: He is alert.  ?Psychiatric:     ?   Behavior: Behavior normal.  ? ? ?ED Results / Procedures / Treatments   ?Labs ?(all labs ordered are listed, but only abnormal results are displayed) ?Labs Reviewed - No data to display ? ?EKG ?None ? ?Radiology ?No results found. ? ?Procedures ?Marland Kitchen.Laceration Repair ? ?Date/Time: 04/09/2021 12:13 PM ?Performed by: Arthor Captain, PA-C ?Authorized by: Arthor Captain, PA-C  ? ?Consent:  ?  Consent obtained:  Verbal ?  Consent given by:  Patient ?  Risks discussed:  Infection, need for additional repair, pain, poor cosmetic result and poor wound healing ?  Alternatives discussed:  No treatment and delayed treatment ?Universal protocol:  ?  Procedure explained and questions answered to patient or proxy's satisfaction: yes   ?  Relevant documents present and verified: yes   ?  Test results available: yes   ?  Imaging studies available: yes   ?  Required blood products, implants, devices, and special equipment available: yes   ?  Site/side marked: yes   ?  Immediately prior to procedure, a time out was called: yes   ?  Patient identity confirmed:  Verbally with patient ?Anesthesia:  ?  Anesthesia method:  Local infiltration ?Laceration details:  ?  Location:  Lip ?  Lip location:  Upper lip, full thickness ?  Vermilion  border involved: yes   ?  Height of lip laceration:  Up to half vertical height ?  Length (cm):  1.5 ?Pre-procedure details:  ?  Preparation:  Patient was prepped and draped in usual sterile fashion ?Treatment:  ?  Area cleansed with:  Povidone-iodine ?  Amount of cleaning:  Standard ?  Irrigation solution:  Sterile saline ?  Irrigation method:  Syringe ?Skin repair:  ?  Repair method:  Sutures ?  Suture size:  5-0 ?  Suture material:  Fast-absorbing gut ?  Suture technique:  Simple interrupted ?  Number of sutures:  3 ?Approximation:  ?  Approximation:  Close ?  Vermilion border well-aligned: yes   ?Repair type:  ?  Repair type:  Simple ?Post-procedure details:  ?  Dressing:  Open (no dressing) ?  Procedure completion:  Tolerated well, no immediate complications  ? ? ?Medications Ordered in ED ?Medications - No data to display ? ?ED Course/ Medical Decision Making/ A&P ?  ?                        ?Medical Decision Making ?Tdap booster given.Pressure irrigation performed. Laceration occurred < 8 hours prior to repair which was well tolerated. Pt has no co morbidities to effect normal wound healing. Discussed suture home care w pt and answered questions. Care instructiojns given to [facility] de[facility] at discharge. ? ?Risk ?Prescription drug management. ? ?Final Clinical Impression(s) / ED Diagnoses ?Final diagnoses:  ?None  ? ? ?Rx / DC Orders ?ED Discharge Orders   ? ? None  ? ?  ? ? ?  ?Arthor Captain, PA-C ?04/09/21 1215 ? ?  ?Derwood Kaplan, MD ?04/10/21 0719 ? ?

## 2021-05-31 ENCOUNTER — Ambulatory Visit: Payer: Self-pay | Admitting: Family Medicine

## 2022-07-18 IMAGING — CT CT HEAD W/O CM
3 series · 16 of 47 positions shown, 19 images · non-contrast
Comparison: None.

CLINICAL DATA: 49-year-old male with altered mental status.

EXAM:
CT HEAD WITHOUT CONTRAST
TECHNIQUE: Contiguous axial images were obtained from the base of the skull
through the vertex without intravenous contrast.

[Series 2: head wo · axial · 0.43mm/px · z∈[+1489,+1639]mm · 10 of 36 slices shown, 13 images]
[im 3/36  brain]
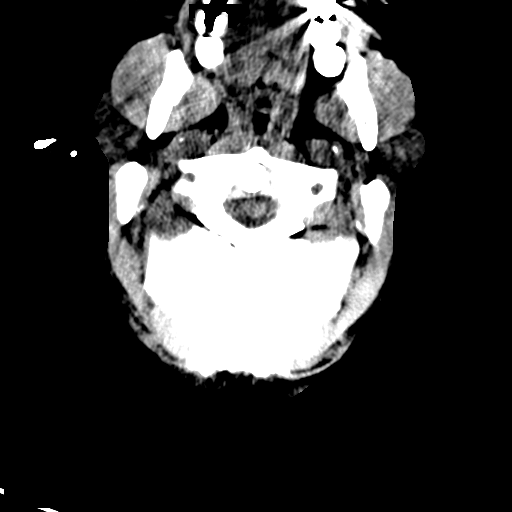
[im 3/36  bone]
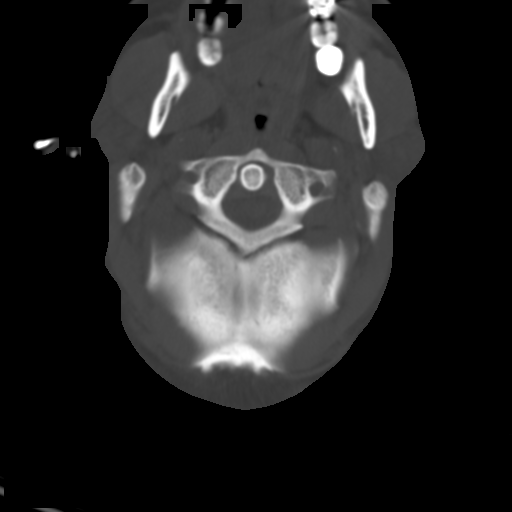
[im 7/36  brain]
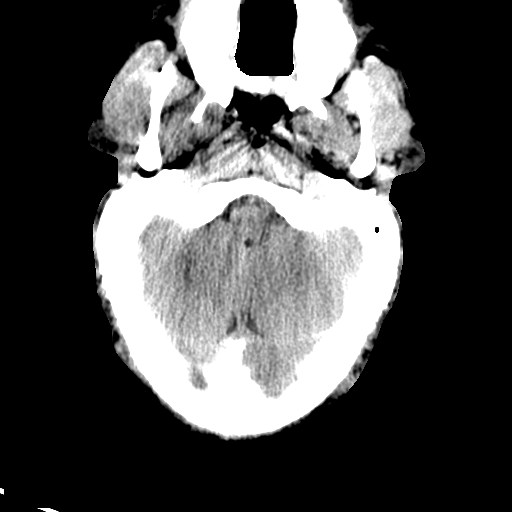
[im 10/36  brain]
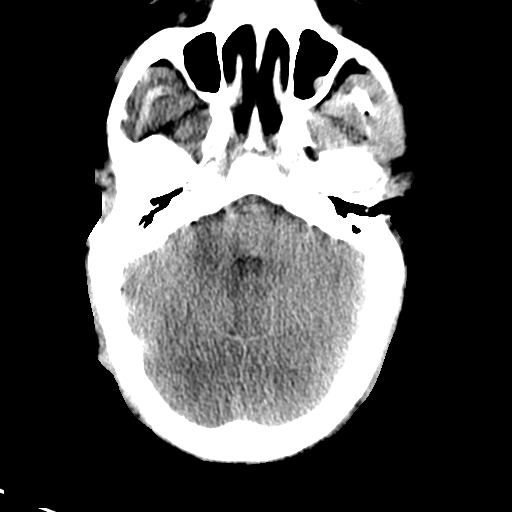
[im 13/36  brain]
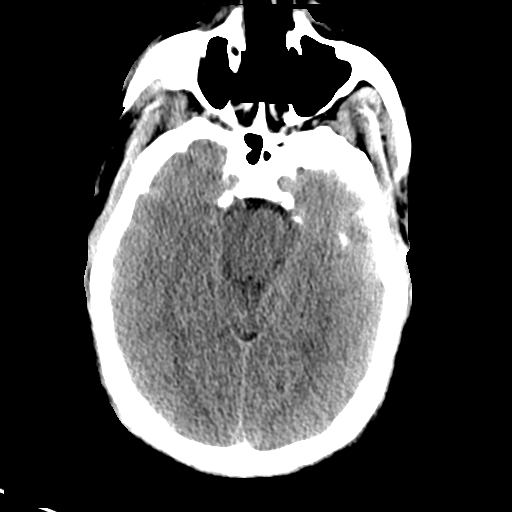
[im 16/36  brain]
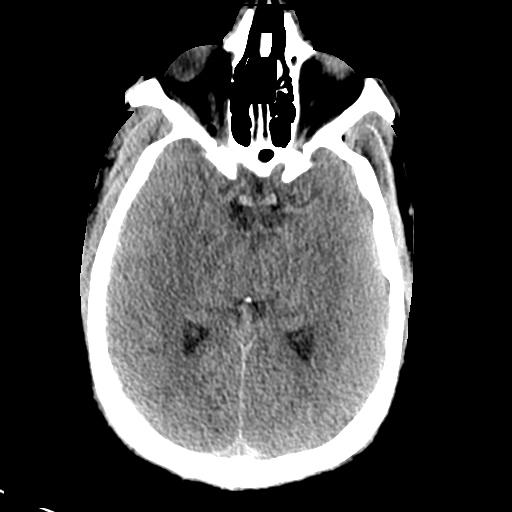
[im 16/36  bone]
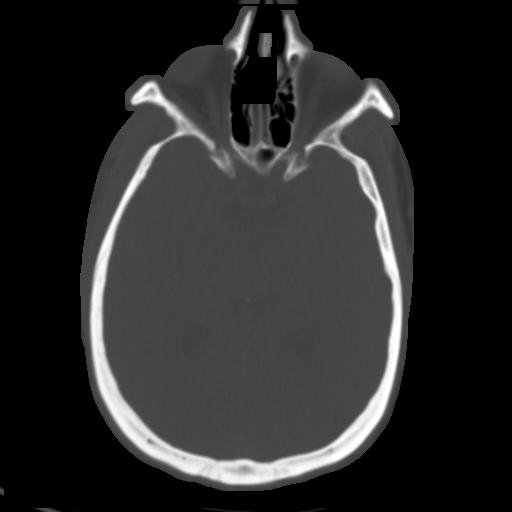
[im 20/36  brain]
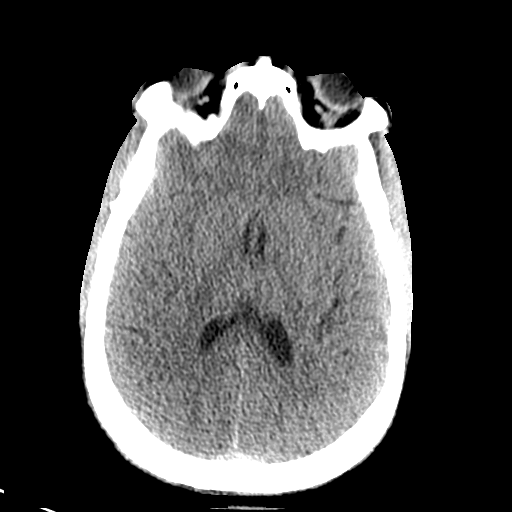
[im 23/36  brain]
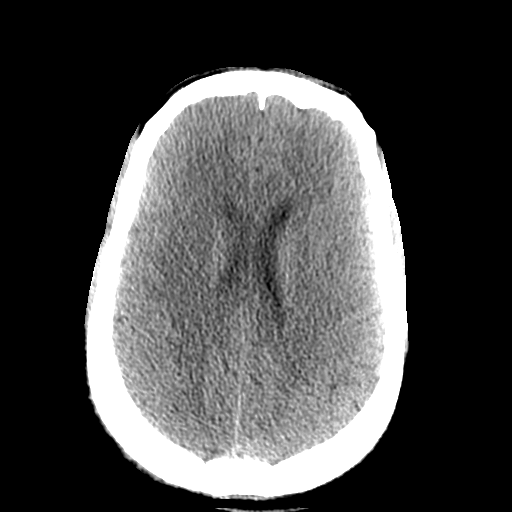
[im 27/36  brain]
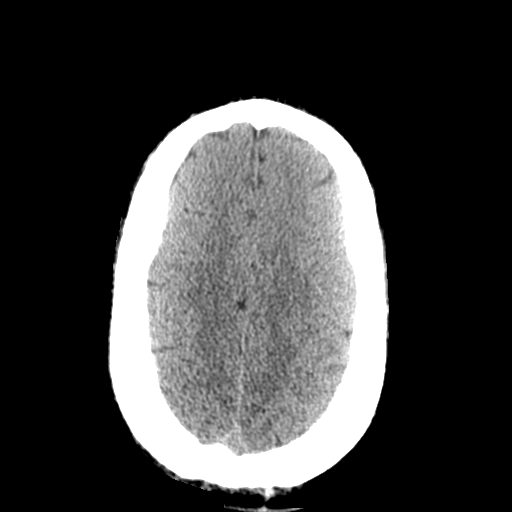
[im 29/36  brain]
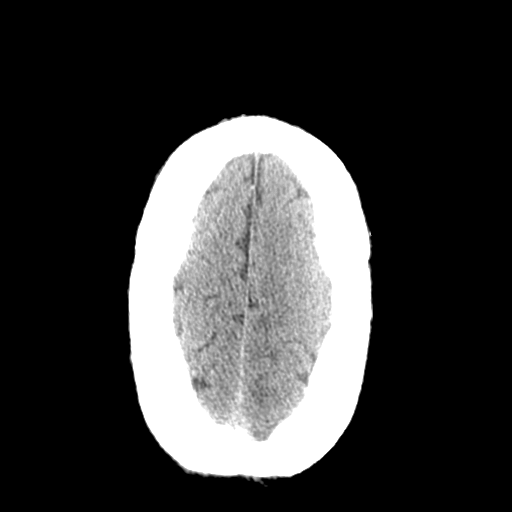
[im 29/36  bone]
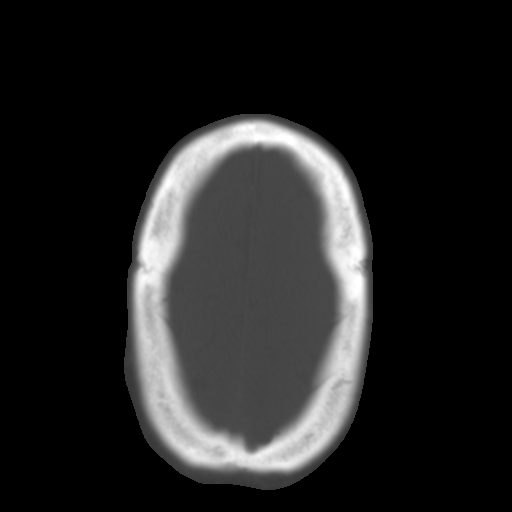
[im 33/36  brain]
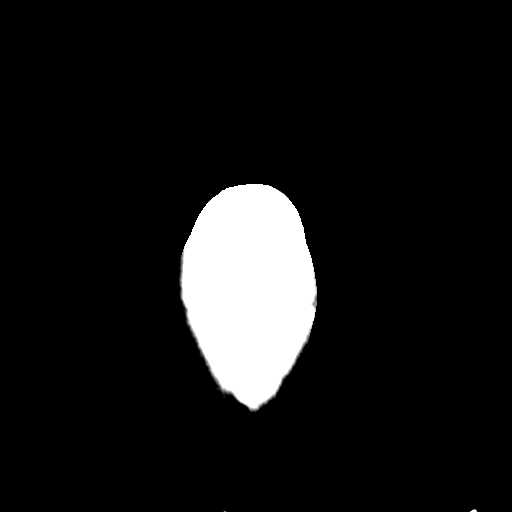

[Series 5: coronal soft tissue · coronal · 0.33mm/px · 3 of 72 slices shown]
[im 26/72  brain]
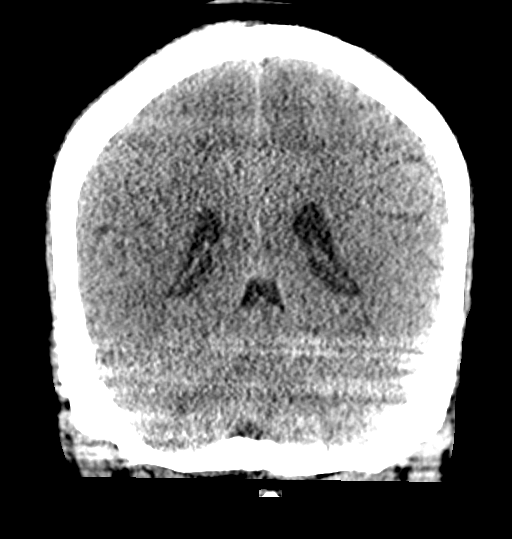
[im 33/72  brain]
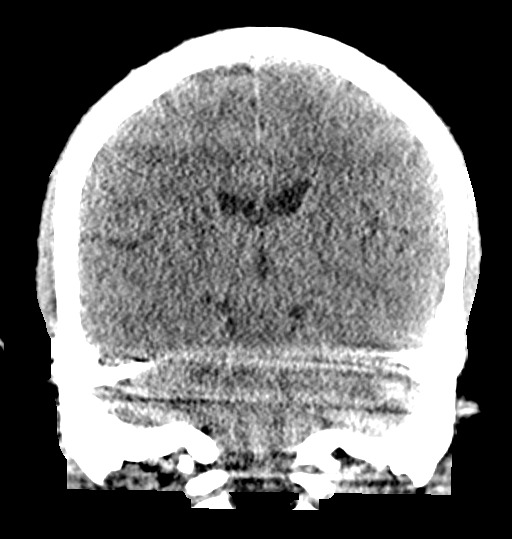
[im 39/72  brain]
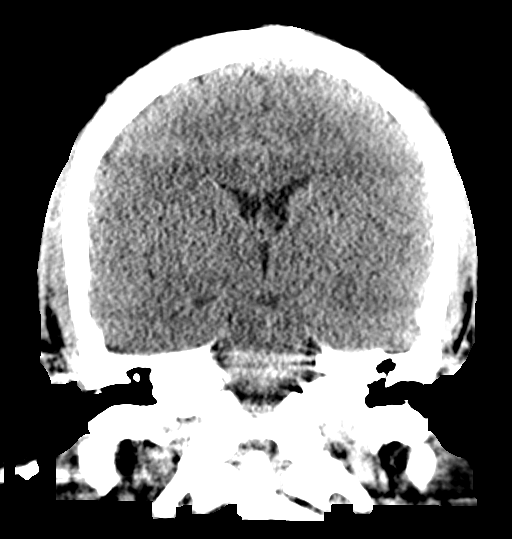

[Series 6: sagittal soft tissue · sagittal · 0.35mm/px · 3 of 55 slices shown]
[im 19/55  brain]
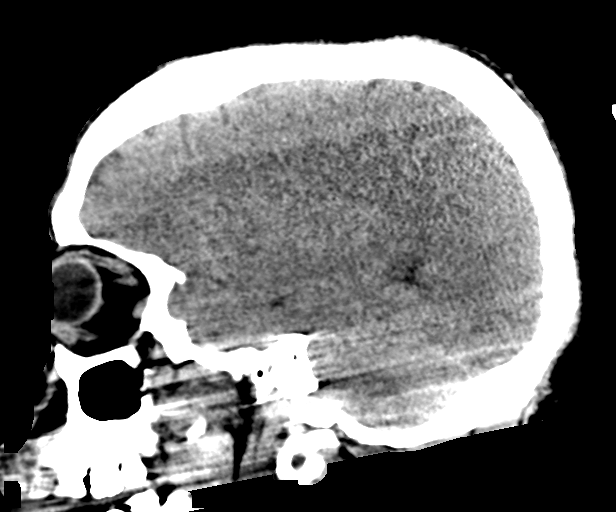
[im 28/55  brain]
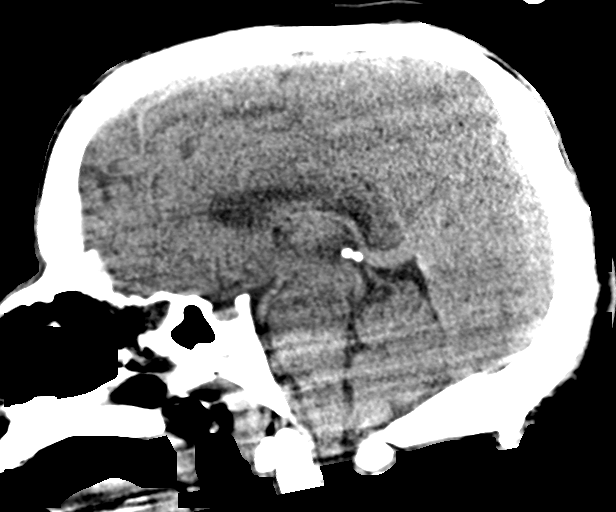
[im 37/55  brain]
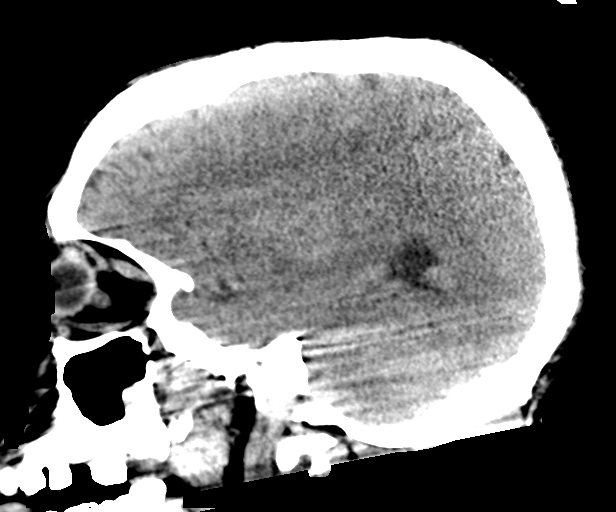

[16 of 47 positions shown; findings below may reference images not displayed]

FINDINGS: Brain: The ventricles and sulci appropriate size for patient's age.
The gray-white matter discrimination is preserved. There is no acute
intracranial hemorrhage. No mass effect or midline shift. No
extra-axial fluid collection.

Vascular: No hyperdense vessel or unexpected calcification.

Skull: Normal. Negative for fracture or focal lesion.

Sinuses/Orbits: Mild mucoperiosteal thickening of paranasal sinuses.
No air-fluid level. The mastoid air cells are clear.

Other: None
IMPRESSION: Unremarkable noncontrast CT of the brain.

## 2022-07-22 IMAGING — DX DG CHEST 1V PORT
1 series · 1 of 1 positions shown · non-contrast
Comparison: 01/20/2020

CLINICAL DATA: Opiate overdose

EXAM:
PORTABLE CHEST 1 VIEW

[chest ap]
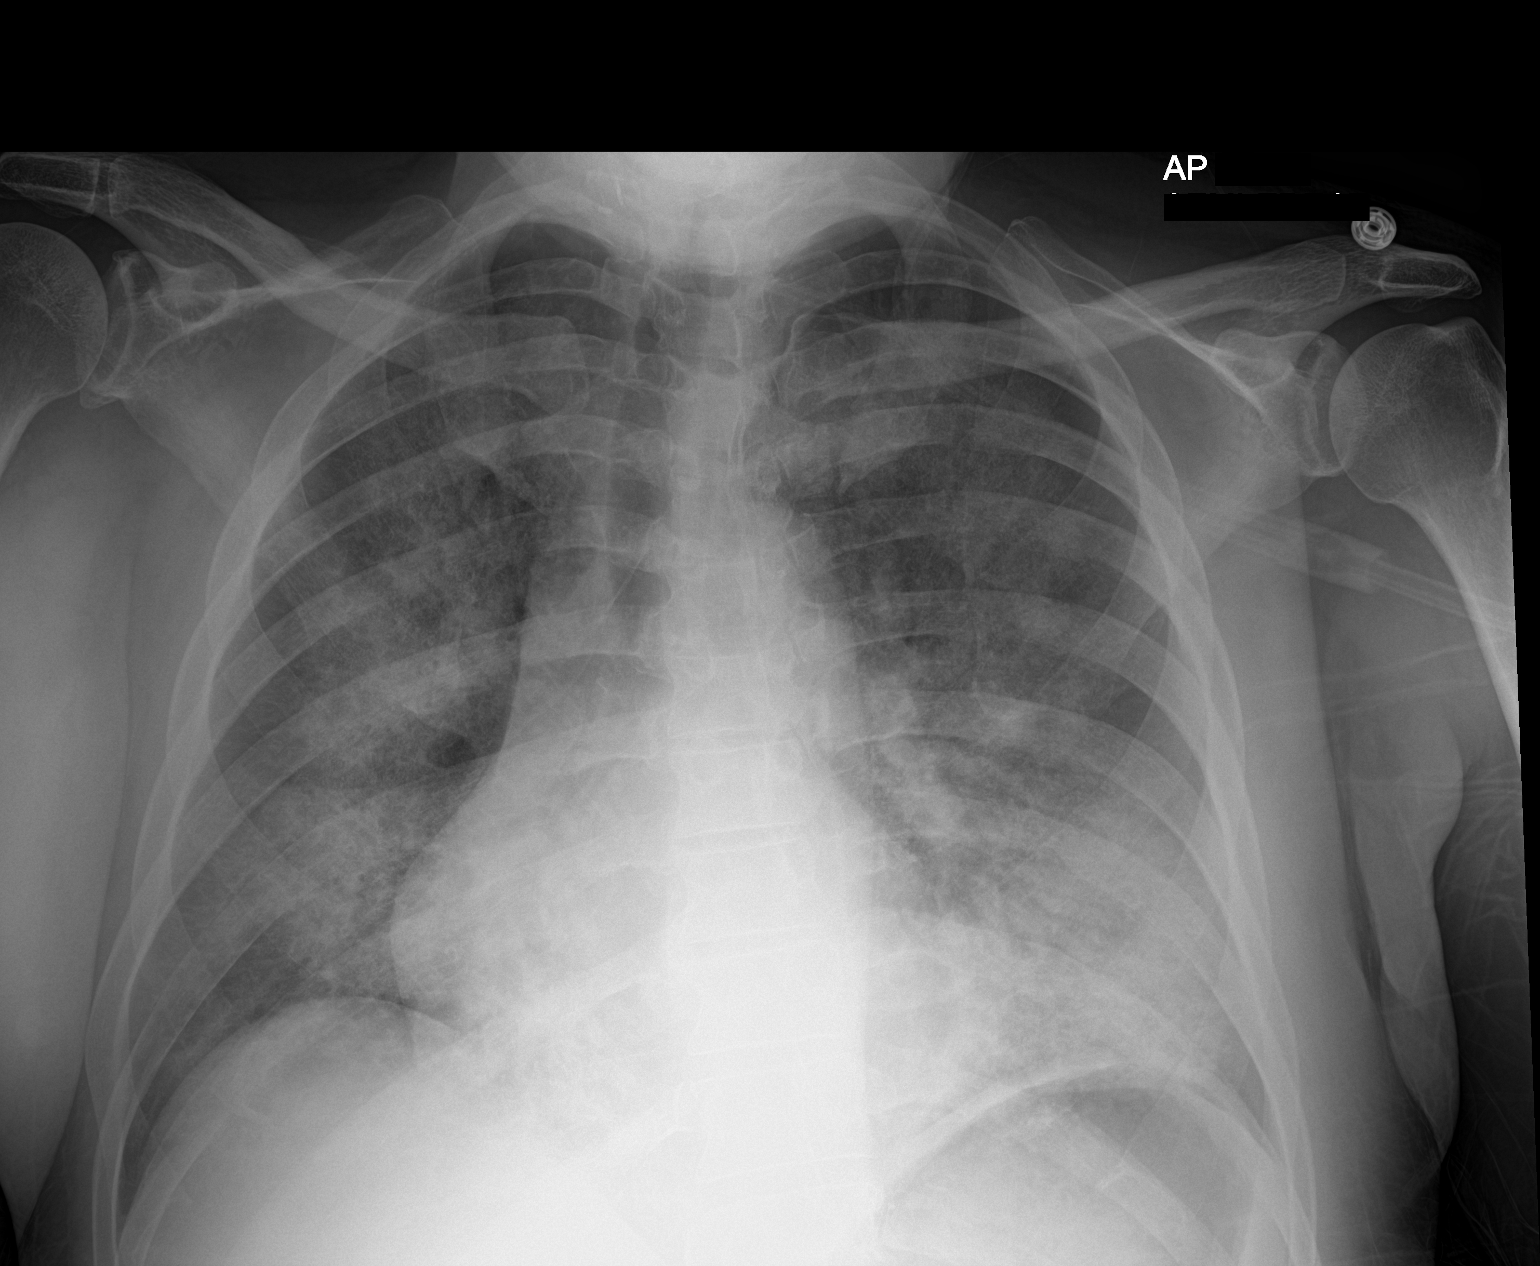

[1 of 1 positions shown; findings below may reference images not displayed]

FINDINGS: Patchy bilateral interstitial and alveolar airspace opacities. No
pleural effusion or pneumothorax. Stable cardiomediastinal
silhouette. No aggressive osseous lesion.
IMPRESSION: Patchy bilateral interstitial and alveolar airspace opacities
concerning for multilobar pneumonia including aspiration.
